# Patient Record
Sex: Female | Born: 1965 | Race: White | Hispanic: No | Marital: Married | Smoking: Never smoker
Health system: Southern US, Community
[De-identification: ages and names within clinical notes are randomized; demographics above are authoritative.]

## PROBLEM LIST (undated history)

## (undated) HISTORY — PX: DILATION AND EVACUATION: SHX1459

---

## 1997-11-21 ENCOUNTER — Emergency Department (HOSPITAL_COMMUNITY): Admission: EM | Admit: 1997-11-21 | Discharge: 1997-11-21 | Payer: Self-pay | Admitting: Emergency Medicine

## 1997-11-29 ENCOUNTER — Encounter: Admission: RE | Admit: 1997-11-29 | Discharge: 1997-11-29 | Payer: Self-pay | Admitting: Internal Medicine

## 1998-05-02 ENCOUNTER — Other Ambulatory Visit: Admission: RE | Admit: 1998-05-02 | Discharge: 1998-05-02 | Payer: Self-pay | Admitting: Gynecology

## 1998-05-12 ENCOUNTER — Inpatient Hospital Stay (HOSPITAL_COMMUNITY): Admission: AD | Admit: 1998-05-12 | Discharge: 1998-05-12 | Payer: Self-pay | Admitting: Gynecology

## 1998-11-25 ENCOUNTER — Inpatient Hospital Stay (HOSPITAL_COMMUNITY): Admission: AD | Admit: 1998-11-25 | Discharge: 1998-11-30 | Payer: Self-pay | Admitting: Gynecology

## 1998-12-25 ENCOUNTER — Encounter: Admission: RE | Admit: 1998-12-25 | Discharge: 1999-03-25 | Payer: Self-pay | Admitting: Gynecology

## 1998-12-31 ENCOUNTER — Other Ambulatory Visit: Admission: RE | Admit: 1998-12-31 | Discharge: 1998-12-31 | Payer: Self-pay | Admitting: Gynecology

## 1999-03-28 ENCOUNTER — Encounter: Admission: RE | Admit: 1999-03-28 | Discharge: 1999-06-03 | Payer: Self-pay | Admitting: Gynecology

## 2000-03-19 ENCOUNTER — Encounter: Payer: Self-pay | Admitting: Family Medicine

## 2000-03-19 ENCOUNTER — Encounter: Admission: RE | Admit: 2000-03-19 | Discharge: 2000-03-19 | Payer: Self-pay | Admitting: Family Medicine

## 2001-03-07 ENCOUNTER — Other Ambulatory Visit: Admission: RE | Admit: 2001-03-07 | Discharge: 2001-03-07 | Payer: Self-pay | Admitting: Family Medicine

## 2002-05-12 ENCOUNTER — Other Ambulatory Visit: Admission: RE | Admit: 2002-05-12 | Discharge: 2002-05-12 | Payer: Self-pay | Admitting: Family Medicine

## 2002-06-19 ENCOUNTER — Encounter: Admission: RE | Admit: 2002-06-19 | Discharge: 2002-06-19 | Payer: Self-pay | Admitting: Family Medicine

## 2002-06-19 ENCOUNTER — Encounter: Payer: Self-pay | Admitting: Family Medicine

## 2003-02-02 ENCOUNTER — Encounter: Admission: RE | Admit: 2003-02-02 | Discharge: 2003-02-02 | Payer: Self-pay | Admitting: Family Medicine

## 2003-05-17 ENCOUNTER — Other Ambulatory Visit: Admission: RE | Admit: 2003-05-17 | Discharge: 2003-05-17 | Payer: Self-pay | Admitting: Family Medicine

## 2003-05-17 ENCOUNTER — Encounter: Admission: RE | Admit: 2003-05-17 | Discharge: 2003-05-17 | Payer: Self-pay | Admitting: Family Medicine

## 2004-03-19 ENCOUNTER — Ambulatory Visit: Payer: Self-pay | Admitting: Family Medicine

## 2004-03-26 ENCOUNTER — Other Ambulatory Visit: Admission: RE | Admit: 2004-03-26 | Discharge: 2004-03-26 | Payer: Self-pay | Admitting: Family Medicine

## 2004-03-26 ENCOUNTER — Ambulatory Visit: Payer: Self-pay | Admitting: Family Medicine

## 2005-01-29 ENCOUNTER — Ambulatory Visit: Payer: Self-pay | Admitting: Family Medicine

## 2005-02-13 ENCOUNTER — Encounter: Admission: RE | Admit: 2005-02-13 | Discharge: 2005-02-13 | Payer: Self-pay | Admitting: Family Medicine

## 2005-03-19 ENCOUNTER — Ambulatory Visit: Payer: Self-pay | Admitting: Family Medicine

## 2005-03-26 ENCOUNTER — Other Ambulatory Visit: Admission: RE | Admit: 2005-03-26 | Discharge: 2005-03-26 | Payer: Self-pay | Admitting: Family Medicine

## 2005-03-26 ENCOUNTER — Encounter: Payer: Self-pay | Admitting: Family Medicine

## 2005-03-26 ENCOUNTER — Ambulatory Visit: Payer: Self-pay | Admitting: Family Medicine

## 2005-08-17 ENCOUNTER — Ambulatory Visit: Payer: Self-pay | Admitting: Family Medicine

## 2005-12-16 ENCOUNTER — Ambulatory Visit: Payer: Self-pay | Admitting: Family Medicine

## 2006-02-17 ENCOUNTER — Encounter: Admission: RE | Admit: 2006-02-17 | Discharge: 2006-02-17 | Payer: Self-pay | Admitting: Family Medicine

## 2006-04-19 ENCOUNTER — Ambulatory Visit: Payer: Self-pay | Admitting: Family Medicine

## 2006-04-19 LAB — CONVERTED CEMR LAB
ALT: 21 units/L (ref 0–40)
AST: 22 units/L (ref 0–37)
Basophils Absolute: 0.1 10*3/uL (ref 0.0–0.1)
Bilirubin, Direct: 0.2 mg/dL (ref 0.0–0.3)
Calcium: 8.9 mg/dL (ref 8.4–10.5)
Cholesterol: 209 mg/dL (ref 0–200)
Direct LDL: 135.2 mg/dL
Eosinophils Absolute: 0.1 10*3/uL (ref 0.0–0.6)
Eosinophils Relative: 0.9 % (ref 0.0–5.0)
Glucose, Bld: 62 mg/dL — ABNORMAL LOW (ref 70–99)
Hemoglobin: 13.9 g/dL (ref 12.0–15.0)
Lymphocytes Relative: 23.2 % (ref 12.0–46.0)
Monocytes Absolute: 0.7 10*3/uL (ref 0.2–0.7)
Neutro Abs: 7.6 10*3/uL (ref 1.4–7.7)
Neutrophils Relative %: 69.5 % (ref 43.0–77.0)
Platelets: 248 10*3/uL (ref 150–400)
Potassium: 3.8 meq/L (ref 3.5–5.1)
RDW: 11.9 % (ref 11.5–14.6)
TSH: 2.75 microintl units/mL (ref 0.35–5.50)
Total Bilirubin: 0.9 mg/dL (ref 0.3–1.2)
Total CHOL/HDL Ratio: 3.6

## 2006-04-30 ENCOUNTER — Encounter: Payer: Self-pay | Admitting: Family Medicine

## 2006-04-30 ENCOUNTER — Other Ambulatory Visit: Admission: RE | Admit: 2006-04-30 | Discharge: 2006-04-30 | Payer: Self-pay | Admitting: Family Medicine

## 2006-04-30 ENCOUNTER — Ambulatory Visit: Payer: Self-pay | Admitting: Family Medicine

## 2006-10-18 ENCOUNTER — Encounter: Payer: Self-pay | Admitting: Family Medicine

## 2006-12-29 DIAGNOSIS — J309 Allergic rhinitis, unspecified: Secondary | ICD-10-CM | POA: Insufficient documentation

## 2007-02-21 ENCOUNTER — Encounter: Admission: RE | Admit: 2007-02-21 | Discharge: 2007-02-21 | Payer: Self-pay | Admitting: Family Medicine

## 2007-04-14 ENCOUNTER — Other Ambulatory Visit: Admission: RE | Admit: 2007-04-14 | Discharge: 2007-04-14 | Payer: Self-pay | Admitting: Gynecology

## 2007-06-15 DIAGNOSIS — N3000 Acute cystitis without hematuria: Secondary | ICD-10-CM

## 2007-06-21 ENCOUNTER — Ambulatory Visit: Payer: Self-pay | Admitting: Family Medicine

## 2007-06-21 LAB — CONVERTED CEMR LAB
Bilirubin Urine: NEGATIVE
Glucose, Urine, Semiquant: NEGATIVE
Nitrite: NEGATIVE
Protein, U semiquant: NEGATIVE
Urobilinogen, UA: NEGATIVE

## 2008-05-28 ENCOUNTER — Encounter: Admission: RE | Admit: 2008-05-28 | Discharge: 2008-05-28 | Payer: Self-pay | Admitting: Family Medicine

## 2008-05-28 ENCOUNTER — Encounter: Payer: Self-pay | Admitting: Gynecology

## 2008-05-28 ENCOUNTER — Other Ambulatory Visit: Admission: RE | Admit: 2008-05-28 | Discharge: 2008-05-28 | Payer: Self-pay | Admitting: Gynecology

## 2008-05-28 ENCOUNTER — Ambulatory Visit: Payer: Self-pay | Admitting: Gynecology

## 2008-07-27 ENCOUNTER — Ambulatory Visit: Payer: Self-pay | Admitting: Gynecology

## 2008-12-25 ENCOUNTER — Ambulatory Visit: Payer: Self-pay | Admitting: Gynecology

## 2009-05-29 ENCOUNTER — Ambulatory Visit: Payer: Self-pay | Admitting: Gynecology

## 2009-05-29 ENCOUNTER — Other Ambulatory Visit: Admission: RE | Admit: 2009-05-29 | Discharge: 2009-05-29 | Payer: Self-pay | Admitting: Gynecology

## 2009-06-03 ENCOUNTER — Encounter: Admission: RE | Admit: 2009-06-03 | Discharge: 2009-06-03 | Payer: Self-pay | Admitting: Gynecology

## 2009-06-10 ENCOUNTER — Encounter: Admission: RE | Admit: 2009-06-10 | Discharge: 2009-06-10 | Payer: Self-pay | Admitting: Gynecology

## 2009-06-19 ENCOUNTER — Ambulatory Visit: Payer: Self-pay | Admitting: Gynecology

## 2009-12-04 ENCOUNTER — Encounter: Admission: RE | Admit: 2009-12-04 | Discharge: 2009-12-04 | Payer: Self-pay | Admitting: Gynecology

## 2010-03-02 HISTORY — PX: BREAST CYST ASPIRATION: SHX578

## 2010-05-05 ENCOUNTER — Other Ambulatory Visit: Payer: Self-pay | Admitting: Gynecology

## 2010-05-05 DIAGNOSIS — Z1231 Encounter for screening mammogram for malignant neoplasm of breast: Secondary | ICD-10-CM

## 2010-06-10 ENCOUNTER — Ambulatory Visit
Admission: RE | Admit: 2010-06-10 | Discharge: 2010-06-10 | Disposition: A | Discharge status: Home / Self Care | Payer: BLUE CROSS/BLUE SHIELD | Source: Ambulatory Visit | Attending: Gynecology | Admitting: Gynecology

## 2010-06-10 DIAGNOSIS — Z1231 Encounter for screening mammogram for malignant neoplasm of breast: Secondary | ICD-10-CM

## 2010-06-18 ENCOUNTER — Encounter (INDEPENDENT_AMBULATORY_CARE_PROVIDER_SITE_OTHER): Payer: BC Managed Care – PPO | Admitting: Gynecology

## 2010-06-18 ENCOUNTER — Other Ambulatory Visit: Payer: Self-pay | Admitting: Gynecology

## 2010-06-18 ENCOUNTER — Other Ambulatory Visit (HOSPITAL_COMMUNITY)
Admission: RE | Admit: 2010-06-18 | Discharge: 2010-06-18 | Disposition: A | Discharge status: Home / Self Care | Payer: BC Managed Care – PPO | Source: Ambulatory Visit | Attending: Gynecology | Admitting: Gynecology

## 2010-06-18 DIAGNOSIS — R635 Abnormal weight gain: Secondary | ICD-10-CM

## 2010-06-18 DIAGNOSIS — Z13220 Encounter for screening for lipoid disorders: Secondary | ICD-10-CM

## 2010-06-18 DIAGNOSIS — Z01419 Encounter for gynecological examination (general) (routine) without abnormal findings: Secondary | ICD-10-CM

## 2010-06-18 DIAGNOSIS — R82998 Other abnormal findings in urine: Secondary | ICD-10-CM

## 2010-06-18 DIAGNOSIS — Z833 Family history of diabetes mellitus: Secondary | ICD-10-CM

## 2010-06-18 DIAGNOSIS — Z124 Encounter for screening for malignant neoplasm of cervix: Secondary | ICD-10-CM | POA: Insufficient documentation

## 2010-07-17 ENCOUNTER — Other Ambulatory Visit: Payer: BC Managed Care – PPO

## 2010-07-18 NOTE — Discharge Summary (Signed)
[name]  PatientLAKEITA Jacobs                            MRN: [id number] Adm. Date:  [id number] Disc. Date: 11/30/98 Attending:  Merrily Pew Dictator:   Gwendolyn Fill. Gatewood, P.A.                           Discharge Summary  DISCHARGE DIAGNOSES: 1. Intrauterine pregnancy 39 weeks, delivered. 2. Mild pregnancy-induced hypertension with decreased platelets. 3. Arrested descent, suspect cephalopelvic disproportion. 4. Status post primary cesarean section, low transverse, Dr. Douglass Rivers on    November 27, 1998.  HISTORY:  This is a 45 years of age female, gravida 2, para 0, with an EDC of November 30, 1998, whose prenatal course was complicated by having a history of positive GC on May 02, 1998, which was treated.  She was also found to have mild increase in blood pressure with mild proteinuria and decreased platelets.  She therefore was admitted on November 26, 1998.  HOSPITAL COURSE:  On November 26, 1998, the patient was admitted for mild PIH ith decreased platelets, question idiopathic decrease in platelets with mild PIH versus HELLP syndrome.  Patient was found to be contracting in labor with augmenting with Pitocin and patient in labor was diagnosed with arrested descent, suspect cephalopelvic disproportion and therefore by Dr. Douglass Rivers underwent a primary cesarean section, low flap transverse on November 27, 1998, and at 10:08 underwent delivery of a female, Apgars of 9 and 9, weight 8 pounds and 0 ounces.  There were no complications.  Postpartum platelet count on November 28, 1998, was 105,000. Patient was asymptomatic and otherwise doing well.  On November 29, 1998 platelets had increased to 128,000.  Blood pressure was normal.  She remained afebrile and voiding.  On November 30, 1998 patient was felt stable for discharge and was given [facility] Gynecology postpartum instructions and postpartum  booklet.  ACCESSORY CLINICAL FINDINGS/LABORATORY:  Patient is O positive, rubella immune. On November 26, 1998, hemoglobin 13.3, hematocrit 39.2, platelets 115,000.  On discharge on November 29, 1998, platelets were 128,000.  DISPOSITION:  The patient is discharged to home.  FOLLOWUP:  Return in six weeks.  If any problems prior to that time, to be seen in the office.  DISCHARGE MEDICATIONS:  She was given a prescription for Percocet p.r.n. pain.  She is to have a CBC at postpartum visit. DD:  01/07/99 TD:  01/07/99 Job: [id number]

## 2010-09-02 ENCOUNTER — Other Ambulatory Visit (INDEPENDENT_AMBULATORY_CARE_PROVIDER_SITE_OTHER): Payer: BC Managed Care – PPO

## 2010-09-02 DIAGNOSIS — E7800 Pure hypercholesterolemia, unspecified: Secondary | ICD-10-CM

## 2011-05-25 ENCOUNTER — Other Ambulatory Visit: Payer: Self-pay | Admitting: Gynecology

## 2011-07-06 ENCOUNTER — Other Ambulatory Visit: Payer: Self-pay | Admitting: Gynecology

## 2011-07-06 DIAGNOSIS — Z1231 Encounter for screening mammogram for malignant neoplasm of breast: Secondary | ICD-10-CM

## 2011-07-24 ENCOUNTER — Encounter: Payer: Self-pay | Admitting: Gynecology

## 2011-07-24 ENCOUNTER — Ambulatory Visit (INDEPENDENT_AMBULATORY_CARE_PROVIDER_SITE_OTHER): Payer: BC Managed Care – PPO | Admitting: Gynecology

## 2011-07-24 VITALS — BP 142/98 | Ht 66.0 in | Wt 164.0 lb

## 2011-07-24 DIAGNOSIS — Z309 Encounter for contraceptive management, unspecified: Secondary | ICD-10-CM

## 2011-07-24 DIAGNOSIS — W57XXXA Bitten or stung by nonvenomous insect and other nonvenomous arthropods, initial encounter: Secondary | ICD-10-CM

## 2011-07-24 DIAGNOSIS — R635 Abnormal weight gain: Secondary | ICD-10-CM

## 2011-07-24 DIAGNOSIS — Z01419 Encounter for gynecological examination (general) (routine) without abnormal findings: Secondary | ICD-10-CM

## 2011-07-24 LAB — CBC WITH DIFFERENTIAL/PLATELET
Eosinophils Absolute: 0.1 10*3/uL (ref 0.0–0.7)
Eosinophils Relative: 1 % (ref 0–5)
Lymphocytes Relative: 25 % (ref 12–46)
Lymphs Abs: 1.9 10*3/uL (ref 0.7–4.0)
MCHC: 33.4 g/dL (ref 30.0–36.0)
MCV: 89.5 fL (ref 78.0–100.0)
Monocytes Relative: 5 % (ref 3–12)
Neutro Abs: 5.1 10*3/uL (ref 1.7–7.7)
Neutrophils Relative %: 69 % (ref 43–77)
Platelets: 235 10*3/uL (ref 150–400)
RDW: 13.6 % (ref 11.5–15.5)

## 2011-07-24 LAB — LIPID PANEL
LDL Cholesterol: 108 mg/dL — ABNORMAL HIGH (ref 0–99)
Total CHOL/HDL Ratio: 3.2 Ratio
VLDL: 24 mg/dL (ref 0–40)

## 2011-07-24 LAB — THYROID PANEL WITH TSH: T4, Total: 11.9 ug/dL (ref 5.0–12.5)

## 2011-07-24 LAB — GLUCOSE, RANDOM: Glucose, Bld: 85 mg/dL (ref 70–99)

## 2011-07-24 MED ORDER — CEFUROXIME AXETIL 250 MG PO TABS: 500.0000 mg | ORAL_TABLET | Freq: Two times a day (BID) | ORAL | Status: AC

## 2011-07-24 MED ORDER — ETONOGESTREL-ETHINYL ESTRADIOL 0.12-0.015 MG/24HR VA RING: VAGINAL_RING | VAGINAL | Status: DC

## 2011-07-24 MED ORDER — DOXYCYCLINE HYCLATE 50 MG PO CAPS: 100.0000 mg | ORAL_CAPSULE | Freq: Two times a day (BID) | ORAL | Status: DC

## 2011-07-24 NOTE — Progress Notes (Signed)
Amy Jacobs [birth year] [id number]   History:    46 y.o.  for annual gyn exam with no complaints today. Patient requesting refill for her NuvaRing which she uses for contraception. Patient scheduled for mammogram next week. Her Pap smear mammogram last year were normal. She states her menstrual cycles are regular. She has increased weight recently.  Past medical history,surgical history, family history and social history were all reviewed and documented in the EPIC chart.  Gynecologic History Patient's last menstrual period was 07/03/2011. Contraception: NuvaRing vaginal inserts Last Pap: 2012. Results were: normal Last mammogram: 2012. Results were: normal  Obstetric History OB History    Grav Para Term Preterm Abortions TAB SAB Ect Mult Living   2 1 1  1     1      # Outc Date GA Lbr Len/2nd Wgt Sex Del Anes PTL Lv   1 TRM     F CS  No Yes   2 ABT                ROS: A ROS was performed and pertinent positives and negatives are included in the history.  GENERAL: No fevers or chills. HEENT: No change in vision, no earache, sore throat or sinus congestion. NECK: No pain or stiffness. CARDIOVASCULAR: No chest pain or pressure. No palpitations. PULMONARY: No shortness of breath, cough or wheeze. GASTROINTESTINAL: No abdominal pain, nausea, vomiting or diarrhea, melena or bright red blood per rectum. GENITOURINARY: No urinary frequency, urgency, hesitancy or dysuria. MUSCULOSKELETAL: No joint or muscle pain, no back pain, no recent trauma. DERMATOLOGIC recent medial left side but bite:ENDOCRINE: No polyuria, polydipsia, no heat or cold intolerance. No recent change in weight. HEMATOLOGICAL: No anemia or easy bruising or bleeding. NEUROLOGIC: No headache, seizures, numbness, tingling or weakness. PSYCHIATRIC: No depression, no loss of interest in normal activity or change in sleep pattern.     Exam: chaperone present  BP 142/98  Ht 5\' 6"  (1.676 m)  Wt 164 lb (74.39 kg)  BMI 26.47 kg/m2   LMP 07/03/2011  Body mass index is 26.47 kg/(m^2).  General appearance : Well developed well nourished female. No acute distress HEENT: Neck supple, trachea midline, no carotid bruits, no thyroidmegaly Lungs: Clear to auscultation, no rhonchi or wheezes, or rib retractions  Heart: Regular rate and rhythm, no murmurs or gallops Breast:Examined in sitting and supine position were symmetrical in appearance, no palpable masses or tenderness,  no skin retraction, no nipple inversion, no nipple discharge, no skin discoloration, no axillary or supraclavicular lymphadenopathy Abdomen: no palpable masses or tenderness, no rebound or guarding Extremities: Medial left thigh 3 erythematous area from recent but bite  Pelvic:  Bartholin, Urethra, Skene Glands: Within normal limits             Vagina: No gross lesions or discharge  Cervix: No gross lesions or discharge  Uterus  anteverted, normal size, shape and consistency, non-tender and mobile  Adnexa  Without masses or tenderness  Anus and perineum  normal   Rectovaginal  normal sphincter tone without palpated masses or tenderness             Hemoccult not done     Assessment/Plan:  46 y.o. female for annual exam who was noted to have on the left medial thigh 3 erythematous areas and when patient was questioned she stated that it appears she had a bug bite from sitting on a bench outdoors recently. She will be placed on Ceftin 500 mg twice a day  for 10 days. She'll be instructed to apply Neosporin twice a day to the area and keep clean. The following labs ordered today: Fasting blood sugar, TSH, fasting lipid profile, CBC, and urinalysis. Pap smear was not done today and screening guidelines discussed. She was instructed to her monthly self breast examination. We discussed importance of calcium vitamin D for osteoporosis prevention as well as regular exercise. I would like her to take her blood pressure at home at least once a day for the next week  and then to see me those readings to review are for her to followup with Dr. Kelle Darting her primary physician for further evaluation of her possible hypertension. Repeat blood pressure was 130/86.    Ok Edwards MD, 9:07 AM 07/24/2011

## 2011-07-24 NOTE — Patient Instructions (Signed)
Cholesterol Control Diet  Cholesterol levels in your body are determined significantly by your diet. Cholesterol levels may also be related to heart disease. The following material helps to explain this relationship and discusses what you can do to help keep your heart healthy. Not all cholesterol is bad. Low-density lipoprotein (LDL) cholesterol is the "bad" cholesterol. It may cause fatty deposits to build up inside your arteries. High-density lipoprotein (HDL) cholesterol is "good." It helps to remove the "bad" LDL cholesterol from your blood. Cholesterol is a very important risk factor for heart disease. Other risk factors are high blood pressure, smoking, stress, heredity, and weight. The heart muscle gets its supply of blood through the coronary arteries. If your LDL cholesterol is high and your HDL cholesterol is low, you are at risk for having fatty deposits build up in your coronary arteries. This leaves less room through which blood can flow. Without sufficient blood and oxygen, the heart muscle cannot function properly and you may feel chest pains (angina pectoris). When a coronary artery closes up entirely, a part of the heart muscle may die, causing a heart attack (myocardial infarction). CHECKING CHOLESTEROL When your caregiver sends your blood to a lab to be analyzed for cholesterol, a complete lipid (fat) profile may be done. With this test, the total amount of cholesterol and levels of LDL and HDL are determined. Triglycerides are a type of fat that circulates in the blood and can also be used to determine heart disease risk. The list below describes what the numbers should be: Test: Total Cholesterol.  Less than 200 mg/dl.  Test: LDL "bad cholesterol."  Less than 100 mg/dl.   Less than 70 mg/dl if you are at very high risk of a heart attack or sudden cardiac death.  Test: HDL "good cholesterol."  Greater than 50 mg/dl for women.    Greater than 40 mg/dl for men.  Test: Triglycerides.  Less than 150 mg/dl.  CONTROLLING CHOLESTEROL WITH DIET Although exercise and lifestyle factors are important, your diet is key. That is because certain foods are known to raise cholesterol and others to lower it. The goal is to balance foods for their effect on cholesterol and more importantly, to replace saturated and trans fat with other types of fat, such as monounsaturated fat, polyunsaturated fat, and omega-3 fatty acids. On average, a person should consume no more than 15 to 17 g of saturated fat daily. Saturated and trans fats are considered "bad" fats, and they will raise LDL cholesterol. Saturated fats are primarily found in animal products such as meats, butter, and cream. However, that does not mean you need to sacrifice all your favorite foods. Today, there are good tasting, low-fat, low-cholesterol substitutes for most of the things you like to eat. Choose low-fat or nonfat alternatives. Choose round or loin cuts of red meat, since these types of cuts are lowest in fat and cholesterol. Chicken (without the skin), fish, veal, and ground [place] breast are excellent choices. Eliminate fatty meats, such as hot dogs and salami. Even shellfish have little or no saturated fat. Have a 3 oz (85 g) portion when you eat lean meat, poultry, or fish. Trans fats are also called "partially hydrogenated oils." They are oils that have been scientifically manipulated so that they are solid at room temperature resulting in a longer shelf life and improved taste and texture of foods in which they are added. Trans fats are found in stick margarine, some tub margarines, cookies, crackers, and baked goods.  When baking and cooking, oils are an excellent substitute for butter. The monounsaturated oils are especially beneficial since it is believed they lower LDL and raise HDL. The oils you should avoid entirely are saturated tropical oils, such as coconut and  palm.  Remember to eat liberally from food groups that are naturally free of saturated and trans fat, including fish, fruit, vegetables, beans, grains (barley, rice, couscous, bulgur wheat), and pasta (without cream sauces).  IDENTIFYING FOODS THAT LOWER CHOLESTEROL  Soluble fiber may lower your cholesterol. This type of fiber is found in fruits such as apples, vegetables such as broccoli, potatoes, and carrots, legumes such as beans, peas, and lentils, and grains such as barley. Foods fortified with plant sterols (phytosterol) may also lower cholesterol. You should eat at least 2 g per day of these foods for a cholesterol lowering effect.  Read package labels to identify low-saturated fats, trans fats free, and low-fat foods at the supermarket. Select cheeses that have only 2 to 3 g saturated fat per ounce. Use a heart-healthy tub margarine that is free of trans fats or partially hydrogenated oil. When buying baked goods (cookies, crackers), avoid partially hydrogenated oils. Breads and muffins should be made from whole grains (whole-wheat or whole oat flour, instead of "flour" or "enriched flour"). Buy non-creamy canned soups with reduced salt and no added fats.  FOOD PREPARATION TECHNIQUES  Never deep-fry. If you must fry, either stir-fry, which uses very little fat, or use non-stick cooking sprays. When possible, broil, bake, or roast meats, and steam vegetables. Instead of dressing vegetables with butter or margarine, use lemon and herbs, applesauce and cinnamon (for squash and sweet potatoes), nonfat yogurt, salsa, and low-fat dressings for salads.  LOW-SATURATED FAT / LOW-FAT FOOD SUBSTITUTES Meats / Saturated Fat (g)  Avoid: Steak, marbled (3 oz/85 g) / 11 g   Choose: Steak, lean (3 oz/85 g) / 4 g   Avoid: Hamburger (3 oz/85 g) / 7 g   Choose: Hamburger, lean (3 oz/85 g) / 5 g   Avoid: Ham (3 oz/85 g) / 6 g   Choose: Ham, lean cut (3 oz/85 g) / 2.4 g   Avoid: Chicken, with skin, dark  meat (3 oz/85 g) / 4 g   Choose: Chicken, skin removed, dark meat (3 oz/85 g) / 2 g   Avoid: Chicken, with skin, light meat (3 oz/85 g) / 2.5 g   Choose: Chicken, skin removed, light meat (3 oz/85 g) / 1 g  Dairy / Saturated Fat (g)  Avoid: Whole milk (1 cup) / 5 g   Choose: Low-fat milk, 2% (1 cup) / 3 g   Choose: Low-fat milk, 1% (1 cup) / 1.5 g   Choose: Skim milk (1 cup) / 0.3 g   Avoid: Hard cheese (1 oz/28 g) / 6 g   Choose: Skim milk cheese (1 oz/28 g) / 2 to 3 g   Avoid: Cottage cheese, 4% fat (1 cup) / 6.5 g   Choose: Low-fat cottage cheese, 1% fat (1 cup) / 1.5 g   Avoid: Ice cream (1 cup) / 9 g   Choose: Sherbet (1 cup) / 2.5 g   Choose: Nonfat frozen yogurt (1 cup) / 0.3 g   Choose: Frozen fruit bar / trace   Avoid: Whipped cream (1 tbs) / 3.5 g   Choose: Nondairy whipped topping (1 tbs) / 1 g  Condiments / Saturated Fat (g)  Avoid: Mayonnaise (1 tbs) / 2 g   Choose: Low-fat  mayonnaise (1 tbs) / 1 g   Avoid: Butter (1 tbs) / 7 g   Choose: Extra light margarine (1 tbs) / 1 g   Avoid: Coconut oil (1 tbs) / 11.8 g   Choose: Olive oil (1 tbs) / 1.8 g   Choose: Corn oil (1 tbs) / 1.7 g   Choose: Safflower oil (1 tbs) / 1.2 g   Choose: Sunflower oil (1 tbs) / 1.4 g   Choose: Soybean oil (1 tbs) / 2.4 g   Choose: Canola oil (1 tbs) / 1 g  Document Released: 02/16/2005 Document Revised: 10/29/2010 Document Reviewed: 08/07/2010 [facility] Patient Information 2012 [place], [place].  Exercise to Lose Weight Exercise and a healthy diet may help you lose weight. Your doctor may suggest specific exercises. EXERCISE IDEAS AND TIPS  Choose low-cost things you enjoy doing, such as walking, bicycling, or exercising to workout videos.   Take stairs instead of the elevator.   Walk during your lunch break.   Park your car further away from work or school.   Go to a gym or an exercise class.   Start with 5 to 10 minutes of exercise each day. Build up to  30 minutes of exercise 4 to 6 days a week.   Wear shoes with good support and comfortable clothes.   Stretch before and after working out.   Work out until you breathe harder and your heart beats faster.   Drink extra water when you exercise.   Do not do so much that you hurt yourself, feel dizzy, or get very short of breath.  Exercises that burn about 150 calories:  Running 1  miles in 15 minutes.   Playing volleyball for 45 to 60 minutes.   Washing and waxing a car for 45 to 60 minutes.   Playing touch football for 45 minutes.   Walking 1  miles in 35 minutes.   Pushing a stroller 1  miles in 30 minutes.   Playing basketball for 30 minutes.   Raking leaves for 30 minutes.   Bicycling 5 miles in 30 minutes.   Walking 2 miles in 30 minutes.   Dancing for 30 minutes.   Shoveling snow for 15 minutes.   Swimming laps for 20 minutes.   Walking up stairs for 15 minutes.   Bicycling 4 miles in 15 minutes.   Gardening for 30 to 45 minutes.   Jumping rope for 15 minutes.   Washing windows or floors for 45 to 60 minutes.  Document Released: 03/21/2010 Document Revised: 10/29/2010 Document Reviewed: 03/21/2010 [facility] Patient Information 2012 [place], [place].    Total calcium daily requirement is 1200-1500mg  per day and no more than 2,000 units of vitamin D

## 2011-07-25 LAB — URINALYSIS W MICROSCOPIC + REFLEX CULTURE
Crystals: NONE SEEN
Leukocytes, UA: NEGATIVE
Nitrite: NEGATIVE
Protein, ur: NEGATIVE mg/dL
Urobilinogen, UA: 0.2 mg/dL (ref 0.0–1.0)
pH: 6 (ref 5.0–8.0)

## 2011-07-26 LAB — URINE CULTURE: Colony Count: NO GROWTH

## 2011-07-30 ENCOUNTER — Ambulatory Visit
Admission: RE | Admit: 2011-07-30 | Discharge: 2011-07-30 | Disposition: A | Discharge status: Home / Self Care | Payer: BC Managed Care – PPO | Source: Ambulatory Visit | Attending: Gynecology | Admitting: Gynecology

## 2011-07-30 DIAGNOSIS — Z1231 Encounter for screening mammogram for malignant neoplasm of breast: Secondary | ICD-10-CM

## 2011-12-01 ENCOUNTER — Other Ambulatory Visit: Payer: Self-pay | Admitting: *Deleted

## 2011-12-01 MED ORDER — ETONOGESTREL-ETHINYL ESTRADIOL 0.12-0.015 MG/24HR VA RING: VAGINAL_RING | VAGINAL | Status: DC

## 2012-07-19 ENCOUNTER — Other Ambulatory Visit: Payer: Self-pay

## 2012-07-19 DIAGNOSIS — Z1231 Encounter for screening mammogram for malignant neoplasm of breast: Secondary | ICD-10-CM

## 2012-08-08 ENCOUNTER — Ambulatory Visit
Admission: RE | Admit: 2012-08-08 | Discharge: 2012-08-08 | Disposition: A | Discharge status: Home / Self Care | Payer: BC Managed Care – PPO | Source: Ambulatory Visit

## 2012-08-08 DIAGNOSIS — Z1231 Encounter for screening mammogram for malignant neoplasm of breast: Secondary | ICD-10-CM

## 2012-10-30 ENCOUNTER — Other Ambulatory Visit: Payer: Self-pay | Admitting: Gynecology

## 2012-11-14 ENCOUNTER — Ambulatory Visit (INDEPENDENT_AMBULATORY_CARE_PROVIDER_SITE_OTHER): Payer: BC Managed Care – PPO | Admitting: Gynecology

## 2012-11-14 ENCOUNTER — Encounter: Payer: Self-pay | Admitting: Gynecology

## 2012-11-14 VITALS — BP 128/76 | Ht 65.75 in | Wt 172.0 lb

## 2012-11-14 DIAGNOSIS — Z01419 Encounter for gynecological examination (general) (routine) without abnormal findings: Secondary | ICD-10-CM

## 2012-11-14 DIAGNOSIS — R635 Abnormal weight gain: Secondary | ICD-10-CM

## 2012-11-14 LAB — CBC WITH DIFFERENTIAL/PLATELET
Basophils Relative: 0 % (ref 0–1)
Eosinophils Relative: 1 % (ref 0–5)
HCT: 41.5 % (ref 36.0–46.0)
Hemoglobin: 14.4 g/dL (ref 12.0–15.0)
MCH: 30.6 pg (ref 26.0–34.0)
MCV: 88.1 fL (ref 78.0–100.0)
Monocytes Absolute: 0.5 10*3/uL (ref 0.1–1.0)
Monocytes Relative: 7 % (ref 3–12)
Neutro Abs: 5.1 10*3/uL (ref 1.7–7.7)
RBC: 4.71 MIL/uL (ref 3.87–5.11)

## 2012-11-14 LAB — LIPID PANEL
Cholesterol: 218 mg/dL — ABNORMAL HIGH (ref 0–200)
HDL: 63 mg/dL (ref >39)
Total CHOL/HDL Ratio: 3.5 Ratio
Triglycerides: 111 mg/dL (ref <150)

## 2012-11-14 LAB — COMPREHENSIVE METABOLIC PANEL
ALT: 14 U/L (ref 0–35)
AST: 14 U/L (ref 0–37)
BUN: 11 mg/dL (ref 6–23)
CO2: 23 mEq/L (ref 19–32)
Creat: 0.86 mg/dL (ref 0.50–1.10)
Sodium: 138 mEq/L (ref 135–145)
Total Bilirubin: 0.4 mg/dL (ref 0.3–1.2)
Total Protein: 6.9 g/dL (ref 6.0–8.3)

## 2012-11-14 MED ORDER — ETONOGESTREL-ETHINYL ESTRADIOL 0.12-0.015 MG/24HR VA RING: 1.0000 | VAGINAL_RING | VAGINAL | Status: DC

## 2012-11-14 NOTE — Patient Instructions (Addendum)
Patient information: High cholesterol (The Basics)  What is cholesterol? - Cholesterol is a substance that is found in the blood. Everyone has some. It is needed for good health. The problem is, people sometimes have too much cholesterol. Compared with people with normal cholesterol, people with high cholesterol have a higher risk of heart attacks, strokes, and other health problems. The higher your cholesterol, the higher your risk of these problems. Cholesterol levels in your body are determined significantly by your diet. Cholesterol levels may also be related to heart disease. The following material helps to explain this relationship and discusses what you can do to help keep your heart healthy. Not all cholesterol is bad. Low-density lipoprotein (LDL) cholesterol is the "bad" cholesterol. It may cause fatty deposits to build up inside your arteries. High-density lipoprotein (HDL) cholesterol is "good." It helps to remove the "bad" LDL cholesterol from your blood. Cholesterol is a very important risk factor for heart disease. Other risk factors are high blood pressure, smoking, stress, heredity, and weight.  The heart muscle gets its supply of blood through the coronary arteries. If your LDL cholesterol is high and your HDL cholesterol is low, you are at risk for having fatty deposits build up in your coronary arteries. This leaves less room through which blood can flow. Without sufficient blood and oxygen, the heart muscle cannot function properly and you may feel chest pains (angina pectoris). When a coronary artery closes up entirely, a part of the heart muscle may die, causing a heart attack (myocardial infarction).  CHECKING CHOLESTEROL When your caregiver sends your blood to a lab to be analyzed for cholesterol, a complete lipid (fat) profile may be done. With this test, the total amount of cholesterol and levels of LDL and HDL are determined. Triglycerides  are a type of fat that circulates in the blood and can also be used to determine heart disease risk. Are there different types of cholesterol? - Yes, there are a few different types. If you get a cholesterol test, you may hear your doctor or nurse talk about: Total cholesterol  LDL cholesterol - Some people call this the "bad" cholesterol. That's because having high LDL levels raises your risk of heart attacks, strokes, and other health problems.  HDL cholesterol - Some people call this the "good" cholesterol. That's because having high HDL levels lowers your risk of heart attacks, strokes, and other health problems.  Non-HDL cholesterol - Non-HDL cholesterol is your total cholesterol minus your HDL cholesterol.  Triglycerides - Triglycerides are not cholesterol. They are a type of fat. But they often get measured when cholesterol is measured. (Having high triglycerides also seems to increase the risk of heart attacks and strokes.)   Keep in mind, though, that many people who cannot meet these goals still have a low risk of heart attacks and strokes. What should I do if my doctor tells me I have high cholesterol? - Ask your doctor what your overall risk of heart attacks and strokes is. High cholesterol, by itself, is not always a reason to worry. Having high cholesterol is just one of many things that can increase your risk of heart attacks and strokes. Other factors that increase your risk include:  Cigarette smoking  High blood pressure  Having a parent, sister, or brother who got heart disease at a young age (Young, in this case, means younger than 55 for men and younger than 65 for women.)  Being a man (Women are at risk, too, but men   have a higher risk.)  Older age  If you are at high risk of heart attacks and strokes, having high cholesterol is a problem. On the other hand, if you have are at low risk, having high cholesterol may not mean much. Should I take medicine to lower cholesterol? - Not  everyone who has high cholesterol needs medicines. Your doctor or nurse will decide if you need them based on your age, family history, and other health concerns.  You should probably take a cholesterol-lowering medicine called a statin if you: Already had a heart attack or stroke  Have known heart disease  Have diabetes  Have a condition called peripheral artery disease, which makes it painful to walk, and happens when the arteries in your legs get clogged with fatty deposits  Have an abdominal aortic aneurysm, which is a widening of the main artery in the belly  Most people with any of the conditions listed above should take a statin no matter what their cholesterol level is. If your doctor or nurse puts you on a statin, stay on it. The medicine may not make you feel any different. But it can help prevent heart attacks, strokes, and death.  Can I lower my cholesterol without medicines? - Yes, you can lower your cholesterol some by:  Avoiding red meat, butter, fried foods, cheese, and other foods that have a lot of saturated fat  Losing weight (if you are overweight)  Being more active Even if these steps do little to change your cholesterol, they can improve your health in many ways.                                                   Cholesterol Control Diet  CONTROLLING CHOLESTEROL WITH DIET Although exercise and lifestyle factors are important, your diet is key. That is because certain foods are known to raise cholesterol and others to lower it. The goal is to balance foods for their effect on cholesterol and more importantly, to replace saturated and trans fat with other types of fat, such as monounsaturated fat, polyunsaturated fat, and omega-3 fatty acids. On average, a person should consume no more than 15 to 17 g of saturated fat daily. Saturated and trans fats are considered "bad" fats, and they will raise LDL cholesterol. Saturated fats are primarily found in animal products such as  meats, butter, and cream. However, that does not mean you need to sacrifice all your favorite foods. Today, there are good tasting, low-fat, low-cholesterol substitutes for most of the things you like to eat. Choose low-fat or nonfat alternatives. Choose round or loin cuts of red meat, since these types of cuts are lowest in fat and cholesterol. Chicken (without the skin), fish, veal, and ground turkey breast are excellent choices. Eliminate fatty meats, such as hot dogs and salami. Even shellfish have little or no saturated fat. Have a 3 oz (85 g) portion when you eat lean meat, poultry, or fish. Trans fats are also called "partially hydrogenated oils." They are oils that have been scientifically manipulated so that they are solid at room temperature resulting in a longer shelf life and improved taste and texture of foods in which they are added. Trans fats are found in stick margarine, some tub margarines, cookies, crackers, and baked goods.  When baking and cooking, oils are an excellent substitute   for butter. The monounsaturated oils are especially beneficial since it is believed they lower LDL and raise HDL. The oils you should avoid entirely are saturated tropical oils, such as coconut and palm.  Remember to eat liberally from food groups that are naturally free of saturated and trans fat, including fish, fruit, vegetables, beans, grains (barley, rice, couscous, bulgur wheat), and pasta (without cream sauces).  IDENTIFYING FOODS THAT LOWER CHOLESTEROL  Soluble fiber may lower your cholesterol. This type of fiber is found in fruits such as apples, vegetables such as broccoli, potatoes, and carrots, legumes such as beans, peas, and lentils, and grains such as barley. Foods fortified with plant sterols (phytosterol) may also lower cholesterol. You should eat at least 2 g per day of these foods for a cholesterol lowering effect.  Read package labels to identify low-saturated fats, trans fats free, and  low-fat foods at the supermarket. Select cheeses that have only 2 to 3 g saturated fat per ounce. Use a heart-healthy tub margarine that is free of trans fats or partially hydrogenated oil. When buying baked goods (cookies, crackers), avoid partially hydrogenated oils. Breads and muffins should be made from whole grains (whole-wheat or whole oat flour, instead of "flour" or "enriched flour"). Buy non-creamy canned soups with reduced salt and no added fats.  FOOD PREPARATION TECHNIQUES  Never deep-fry. If you must fry, either stir-fry, which uses very little fat, or use non-stick cooking sprays. When possible, broil, bake, or roast meats, and steam vegetables. Instead of dressing vegetables with butter or margarine, use lemon and herbs, applesauce and cinnamon (for squash and sweet potatoes), nonfat yogurt, salsa, and low-fat dressings for salads.  LOW-SATURATED FAT / LOW-FAT FOOD SUBSTITUTES Meats / Saturated Fat (g)  Avoid: Steak, marbled (3 oz/85 g) / 11 g   Choose: Steak, lean (3 oz/85 g) / 4 g   Avoid: Hamburger (3 oz/85 g) / 7 g   Choose: Hamburger, lean (3 oz/85 g) / 5 g   Avoid: Ham (3 oz/85 g) / 6 g   Choose: Ham, lean cut (3 oz/85 g) / 2.4 g   Avoid: Chicken, with skin, dark meat (3 oz/85 g) / 4 g   Choose: Chicken, skin removed, dark meat (3 oz/85 g) / 2 g   Avoid: Chicken, with skin, light meat (3 oz/85 g) / 2.5 g   Choose: Chicken, skin removed, light meat (3 oz/85 g) / 1 g  Dairy / Saturated Fat (g)  Avoid: Whole milk (1 cup) / 5 g   Choose: Low-fat milk, 2% (1 cup) / 3 g   Choose: Low-fat milk, 1% (1 cup) / 1.5 g   Choose: Skim milk (1 cup) / 0.3 g   Avoid: Hard cheese (1 oz/28 g) / 6 g   Choose: Skim milk cheese (1 oz/28 g) / 2 to 3 g   Avoid: Cottage cheese, 4% fat (1 cup) / 6.5 g   Choose: Low-fat cottage cheese, 1% fat (1 cup) / 1.5 g   Avoid: Ice cream (1 cup) / 9 g   Choose: Sherbet (1 cup) / 2.5 g   Choose: Nonfat frozen yogurt (1 cup) / 0.3 g    Choose: Frozen fruit bar / trace   Avoid: Whipped cream (1 tbs) / 3.5 g   Choose: Nondairy whipped topping (1 tbs) / 1 g  Condiments / Saturated Fat (g)  Avoid: Mayonnaise (1 tbs) / 2 g   Choose: Low-fat mayonnaise (1 tbs) / 1 g   Avoid:   Butter (1 tbs) / 7 g   Choose: Extra light margarine (1 tbs) / 1 g   Avoid: Coconut oil (1 tbs) / 11.8 g   Choose: Olive oil (1 tbs) / 1.8 g   Choose: Corn oil (1 tbs) / 1.7 g   Choose: Safflower oil (1 tbs) / 1.2 g   Choose: Sunflower oil (1 tbs) / 1.4 g   Choose: Soybean oil (1 tbs) / 2.4 g   Choose: Canola oil (1 tbs) / 1 g  Exercise to Lose Weight Exercise and a healthy diet may help you lose weight. Your doctor may suggest specific exercises. EXERCISE IDEAS AND TIPS  Choose low-cost things you enjoy doing, such as walking, bicycling, or exercising to workout videos.   Take stairs instead of the elevator.   Walk during your lunch break.   Park your car further away from work or school.   Go to a gym or an exercise class.   Start with 5 to 10 minutes of exercise each day. Build up to 30 minutes of exercise 4 to 6 days a week.   Wear shoes with good support and comfortable clothes.   Stretch before and after working out.   Work out until you breathe harder and your heart beats faster.   Drink extra water when you exercise.   Do not do so much that you hurt yourself, feel dizzy, or get very short of breath.  Exercises that burn about 150 calories:  Running 1  miles in 15 minutes.   Playing volleyball for 45 to 60 minutes.   Washing and waxing a car for 45 to 60 minutes.   Playing touch football for 45 minutes.   Walking 1  miles in 35 minutes.   Pushing a stroller 1  miles in 30 minutes.   Playing basketball for 30 minutes.   Raking leaves for 30 minutes.   Bicycling 5 miles in 30 minutes.   Walking 2 miles in 30 minutes.   Dancing for 30 minutes.   Shoveling snow for 15 minutes.   Swimming laps  for 20 minutes.   Walking up stairs for 15 minutes.   Bicycling 4 miles in 15 minutes.   Gardening for 30 to 45 minutes.   Jumping rope for 15 minutes.   Washing windows or floors for 45 to 60 minutes.  Document Released: 03/21/2010 Document Revised: 10/29/2010 Document Reviewed: 03/21/2010 [employer] Patient Information 2012 [employer].                                           

## 2012-11-14 NOTE — Progress Notes (Signed)
Amy Jacobs 06-05-1965 [id number]   History:    47 y.o.  for annual gyn exam with the only complaint is that occasionally she gets gastroesophageal reflux depending on the food that she eats. She also at times felt that she may have lactose intolerance because when she drank her latte she has felt bloated but no longer drinking it. Patient over 30 years ago had cryotherapy of her cervix but subsequent Pap smears have all been normal. She is using the nova ring for contraception and is having regular menstrual cycles. Her primary physician is Dr. Tawanna Cooler and patient has not seen him in over a year. She is currently fasting today for her lab work. Review of her records indicated she was weighing 164 and is now weighing 172 pounds. Her Tdap vaccine is up to date. Her last mammogram was in June of this year which was normal.  Past medical history,surgical history, family history and social history were all reviewed and documented in the EPIC chart.  Gynecologic History Patient's last menstrual period was 10/31/2012. Contraception: NuvaRing vaginal inserts Last Pap: 2012. Results were: normal Last mammogram: 2014. Results were: normal  Obstetric History OB History  Gravida Para Term Preterm AB SAB TAB Ectopic Multiple Living  2 1 1  1     1     # Outcome Date GA Lbr Len/2nd Weight Sex Delivery Anes PTL Lv  2 ABT           1 TRM     F CS  N Y       ROS: A ROS was performed and pertinent positives and negatives are included in the history.  GENERAL: No fevers or chills. HEENT: No change in vision, no earache, sore throat or sinus congestion. NECK: No pain or stiffness. CARDIOVASCULAR: No chest pain or pressure. No palpitations. PULMONARY: No shortness of breath, cough or wheeze. GASTROINTESTINAL: No abdominal pain, nausea, vomiting or diarrhea, melena or bright red blood per rectum. GENITOURINARY: No urinary frequency, urgency, hesitancy or dysuria. MUSCULOSKELETAL: No joint or muscle pain, no back  pain, no recent trauma. DERMATOLOGIC: No rash, no itching, no lesions. ENDOCRINE: No polyuria, polydipsia, no heat or cold intolerance. No recent change in weight. HEMATOLOGICAL: No anemia or easy bruising or bleeding. NEUROLOGIC: No headache, seizures, numbness, tingling or weakness. PSYCHIATRIC: No depression, no loss of interest in normal activity or change in sleep pattern.     Exam: chaperone present  BP 128/76  Ht 5' 5.75" (1.67 m)  Wt 172 lb (78.019 kg)  BMI 27.97 kg/m2  LMP 10/31/2012  Body mass index is 27.97 kg/(m^2).  General appearance : Well developed well nourished female. No acute distress HEENT: Neck supple, trachea midline, no carotid bruits, no thyroidmegaly Lungs: Clear to auscultation, no rhonchi or wheezes, or rib retractions  Heart: Regular rate and rhythm, no murmurs or gallops Breast:Examined in sitting and supine position were symmetrical in appearance, no palpable masses or tenderness,  no skin retraction, no nipple inversion, no nipple discharge, no skin discoloration, no axillary or supraclavicular lymphadenopathy Abdomen: no palpable masses or tenderness, no rebound or guarding Extremities: no edema or skin discoloration or tenderness  Pelvic:  Bartholin, Urethra, Skene Glands: Within normal limits             Vagina: No gross lesions or discharge  Cervix: No gross lesions or discharge  Uterus  anteverted, normal size, shape and consistency, non-tender and mobile  Adnexa  Without masses or tenderness  Anus and perineum  normal   Rectovaginal  normal sphincter tone without palpated masses or tenderness             Hemoccult cards provided     Assessment/Plan:  47 y.o. female for annual exam with occasional GERD which she takes TUMS when necessary. Hemoccult cards were provided for her to submit to the office for testing. Patient with no family history of GI malignancy. Pap smear not done today. New guidelines were discussed. Patient was reminded on the  importance of monthly breast exam. We also discussed importance of calcium and vitamin D in regular exercise for osteoporosis prevention and weight loss. The following labs were today: Fasting lipid profile, TSH, comprehensive metabolic panel, CBC, and urinalysis.    Ok Edwards MD, 9:34 AM 11/14/2012

## 2012-11-15 LAB — URINALYSIS W MICROSCOPIC + REFLEX CULTURE
Bacteria, UA: NONE SEEN
Crystals: NONE SEEN
Glucose, UA: NEGATIVE mg/dL
Leukocytes, UA: NEGATIVE
Nitrite: NEGATIVE
Protein, ur: NEGATIVE mg/dL
Specific Gravity, Urine: 1.015 (ref 1.005–1.030)
Urobilinogen, UA: 0.2 mg/dL (ref 0.0–1.0)
pH: 5 (ref 5.0–8.0)

## 2012-11-15 LAB — TSH: TSH: 2.618 u[IU]/mL (ref 0.350–4.500)

## 2013-07-13 ENCOUNTER — Other Ambulatory Visit: Payer: Self-pay | Admitting: Gynecology

## 2013-07-28 ENCOUNTER — Other Ambulatory Visit: Payer: Self-pay

## 2013-07-28 DIAGNOSIS — Z1231 Encounter for screening mammogram for malignant neoplasm of breast: Secondary | ICD-10-CM

## 2013-08-25 ENCOUNTER — Ambulatory Visit
Admission: RE | Admit: 2013-08-25 | Discharge: 2013-08-25 | Disposition: A | Discharge status: Home / Self Care | Payer: BC Managed Care – PPO | Source: Ambulatory Visit

## 2013-08-25 ENCOUNTER — Encounter (INDEPENDENT_AMBULATORY_CARE_PROVIDER_SITE_OTHER): Payer: Self-pay

## 2013-08-25 DIAGNOSIS — Z1231 Encounter for screening mammogram for malignant neoplasm of breast: Secondary | ICD-10-CM

## 2013-11-17 ENCOUNTER — Encounter: Payer: BC Managed Care – PPO | Admitting: Gynecology

## 2013-12-04 ENCOUNTER — Other Ambulatory Visit (HOSPITAL_COMMUNITY)
Admission: RE | Admit: 2013-12-04 | Discharge: 2013-12-04 | Disposition: A | Discharge status: Home / Self Care | Payer: BC Managed Care – PPO | Source: Ambulatory Visit | Attending: Gynecology | Admitting: Gynecology

## 2013-12-04 ENCOUNTER — Encounter: Payer: Self-pay | Admitting: Gynecology

## 2013-12-04 ENCOUNTER — Ambulatory Visit (INDEPENDENT_AMBULATORY_CARE_PROVIDER_SITE_OTHER): Payer: BC Managed Care – PPO | Admitting: Gynecology

## 2013-12-04 VITALS — BP 126/82 | Ht 66.0 in | Wt 171.0 lb

## 2013-12-04 DIAGNOSIS — Z01419 Encounter for gynecological examination (general) (routine) without abnormal findings: Secondary | ICD-10-CM

## 2013-12-04 DIAGNOSIS — Z1151 Encounter for screening for human papillomavirus (HPV): Secondary | ICD-10-CM | POA: Insufficient documentation

## 2013-12-04 LAB — CBC WITH DIFFERENTIAL/PLATELET
Basophils Absolute: 0 10*3/uL (ref 0.0–0.1)
Basophils Relative: 0 % (ref 0–1)
Eosinophils Absolute: 0.1 10*3/uL (ref 0.0–0.7)
Eosinophils Relative: 1 % (ref 0–5)
HCT: 40.2 % (ref 36.0–46.0)
Hemoglobin: 14 g/dL (ref 12.0–15.0)
Lymphocytes Relative: 23 % (ref 12–46)
Lymphs Abs: 2.1 10*3/uL (ref 0.7–4.0)
MCH: 30.4 pg (ref 26.0–34.0)
MCHC: 34.8 g/dL (ref 30.0–36.0)
MCV: 87.4 fL (ref 78.0–100.0)
Monocytes Absolute: 0.6 10*3/uL (ref 0.1–1.0)
Monocytes Relative: 6 % (ref 3–12)
Neutro Abs: 6.5 10*3/uL (ref 1.7–7.7)
Neutrophils Relative %: 70 % (ref 43–77)
Platelets: 219 10*3/uL (ref 150–400)
RBC: 4.6 MIL/uL (ref 3.87–5.11)
RDW: 13 % (ref 11.5–15.5)
WBC: 9.3 10*3/uL (ref 4.0–10.5)

## 2013-12-04 LAB — COMPREHENSIVE METABOLIC PANEL
ALT: 19 U/L (ref 0–35)
AST: 16 U/L (ref 0–37)
Albumin: 4.2 g/dL (ref 3.5–5.2)
Alkaline Phosphatase: 58 U/L (ref 39–117)
BUN: 11 mg/dL (ref 6–23)
CO2: 25 mEq/L (ref 19–32)
Calcium: 9.1 mg/dL (ref 8.4–10.5)
Chloride: 104 mEq/L (ref 96–112)
Creat: 0.84 mg/dL (ref 0.50–1.10)
Glucose, Bld: 78 mg/dL (ref 70–99)
Potassium: 4.2 mEq/L (ref 3.5–5.3)
Sodium: 138 mEq/L (ref 135–145)
Total Bilirubin: 0.6 mg/dL (ref 0.2–1.2)
Total Protein: 6.7 g/dL (ref 6.0–8.3)

## 2013-12-04 LAB — LIPID PANEL
Cholesterol: 211 mg/dL — ABNORMAL HIGH (ref 0–200)
HDL: 62 mg/dL (ref >39)
LDL Cholesterol: 119 mg/dL — ABNORMAL HIGH (ref 0–99)
Total CHOL/HDL Ratio: 3.4 Ratio
Triglycerides: 151 mg/dL — ABNORMAL HIGH (ref <150)
VLDL: 30 mg/dL (ref 0–40)

## 2013-12-04 LAB — TSH: TSH: 3.025 u[IU]/mL (ref 0.350–4.500)

## 2013-12-04 MED ORDER — ETONOGESTREL-ETHINYL ESTRADIOL 0.12-0.015 MG/24HR VA RING: 1.0000 | VAGINAL_RING | VAGINAL | Status: DC

## 2013-12-04 NOTE — Patient Instructions (Signed)

## 2013-12-04 NOTE — Progress Notes (Signed)
Amy Jacobs 09/22/1965 [id number]   History:    48 y.o.  for annual gyn exam with no complaints today.Patient over 30 years ago had cryotherapy of her cervix but subsequent Pap smears have all been normal. The patient has been using the NuvaRing for contraception. She has not seen her primary physician in over a year would like to have her fasting blood work done here. Her Tdap vaccine and flu vaccine's are up-to-date.   Past medical history,surgical history, family history and social history were all reviewed and documented in the EPIC chart.  Gynecologic History Patient's last menstrual period was 11/23/2013. Contraception: NuvaRing vaginal inserts Last Pap: 2012. Results were: normal Last mammogram: 2015. Results were: Normal but dense  Obstetric History OB History  Gravida Para Term Preterm AB SAB TAB Ectopic Multiple Living  2 1 1  1     1     # Outcome Date GA Lbr Len/2nd Weight Sex Delivery Anes PTL Lv  2 ABT           1 TRM     F CS  N Y       ROS: A ROS was performed and pertinent positives and negatives are included in the history.  GENERAL: No fevers or chills. HEENT: No change in vision, no earache, sore throat or sinus congestion. NECK: No pain or stiffness. CARDIOVASCULAR: No chest pain or pressure. No palpitations. PULMONARY: No shortness of breath, cough or wheeze. GASTROINTESTINAL: No abdominal pain, nausea, vomiting or diarrhea, melena or bright red blood per rectum. GENITOURINARY: No urinary frequency, urgency, hesitancy or dysuria. MUSCULOSKELETAL: No joint or muscle pain, no back pain, no recent trauma. DERMATOLOGIC: No rash, no itching, no lesions. ENDOCRINE: No polyuria, polydipsia, no heat or cold intolerance. No recent change in weight. HEMATOLOGICAL: No anemia or easy bruising or bleeding. NEUROLOGIC: No headache, seizures, numbness, tingling or weakness. PSYCHIATRIC: No depression, no loss of interest in normal activity or change in sleep pattern.      Exam: chaperone present  BP 126/82  Ht 5\' 6"  (1.676 m)  Wt 171 lb (77.565 kg)  BMI 27.61 kg/m2  LMP 11/23/2013  Body mass index is 27.61 kg/(m^2).  General appearance : Well developed well nourished female. No acute distress HEENT: Neck supple, trachea midline, no carotid bruits, no thyroidmegaly Lungs: Clear to auscultation, no rhonchi or wheezes, or rib retractions  Heart: Regular rate and rhythm, no murmurs or gallops Breast:Examined in sitting and supine position were symmetrical in appearance, no palpable masses or tenderness,  no skin retraction, no nipple inversion, no nipple discharge, no skin discoloration, no axillary or supraclavicular lymphadenopathy Abdomen: no palpable masses or tenderness, no rebound or guarding Extremities: no edema or skin discoloration or tenderness  Pelvic:  Bartholin, Urethra, Skene Glands: Within normal limits             Vagina: No gross lesions or discharge  Cervix: No gross lesions or discharge  Uterus  anteverted, normal size, shape and consistency, non-tender and mobile  Adnexa  Without masses or tenderness  Anus and perineum  normal   Rectovaginal  normal sphincter tone without palpated masses or tenderness             Hemoccult that indicated     Assessment/Plan:  48 y.o. female for annual exam who is reminded of the importance of monthly breast exams. We also discussed importance of calcium vitamin D and regular exercise for osteoporosis prevention. Patient will need a baseline colonoscopy next  year when she turns 49. The following labs were ordered today: CBC, fasting lipid profile, comprehensive metabolic panel, TSH, urinalysis and Pap smear.   Ok Edwards MD, 10:28 AM 12/04/2013

## 2013-12-05 LAB — URINALYSIS W MICROSCOPIC + REFLEX CULTURE
BACTERIA UA: NONE SEEN
Bilirubin Urine: NEGATIVE
CRYSTALS: NONE SEEN
Casts: NONE SEEN
Glucose, UA: NEGATIVE mg/dL
KETONES UR: NEGATIVE mg/dL
LEUKOCYTES UA: NEGATIVE
NITRITE: NEGATIVE
Protein, ur: NEGATIVE mg/dL
SPECIFIC GRAVITY, URINE: 1.019 (ref 1.005–1.030)
UROBILINOGEN UA: 0.2 mg/dL (ref 0.0–1.0)
pH: 5 (ref 5.0–8.0)

## 2013-12-05 LAB — CYTOLOGY - PAP

## 2014-01-01 ENCOUNTER — Encounter: Payer: Self-pay | Admitting: Gynecology

## 2014-04-16 ENCOUNTER — Other Ambulatory Visit: Payer: Self-pay | Admitting: Gynecology

## 2014-07-27 ENCOUNTER — Other Ambulatory Visit: Payer: Self-pay

## 2014-07-27 DIAGNOSIS — Z1231 Encounter for screening mammogram for malignant neoplasm of breast: Secondary | ICD-10-CM

## 2014-09-21 ENCOUNTER — Other Ambulatory Visit: Payer: Self-pay | Admitting: Gynecology

## 2014-09-21 ENCOUNTER — Ambulatory Visit
Admission: RE | Admit: 2014-09-21 | Discharge: 2014-09-21 | Disposition: A | Discharge status: Home / Self Care | Payer: BLUE CROSS/BLUE SHIELD | Source: Ambulatory Visit

## 2014-09-21 DIAGNOSIS — R928 Other abnormal and inconclusive findings on diagnostic imaging of breast: Secondary | ICD-10-CM

## 2014-09-21 DIAGNOSIS — Z1231 Encounter for screening mammogram for malignant neoplasm of breast: Secondary | ICD-10-CM

## 2014-10-01 ENCOUNTER — Ambulatory Visit
Admission: RE | Admit: 2014-10-01 | Discharge: 2014-10-01 | Disposition: A | Discharge status: Home / Self Care | Payer: BLUE CROSS/BLUE SHIELD | Source: Ambulatory Visit | Attending: Gynecology | Admitting: Gynecology

## 2014-10-01 DIAGNOSIS — R928 Other abnormal and inconclusive findings on diagnostic imaging of breast: Secondary | ICD-10-CM

## 2014-12-14 ENCOUNTER — Encounter: Payer: Self-pay | Admitting: Gynecology

## 2014-12-14 ENCOUNTER — Ambulatory Visit (INDEPENDENT_AMBULATORY_CARE_PROVIDER_SITE_OTHER): Payer: BLUE CROSS/BLUE SHIELD | Admitting: Gynecology

## 2014-12-14 VITALS — BP 128/86 | Ht 65.5 in | Wt 174.0 lb

## 2014-12-14 DIAGNOSIS — Z23 Encounter for immunization: Secondary | ICD-10-CM | POA: Diagnosis not present

## 2014-12-14 DIAGNOSIS — Z01419 Encounter for gynecological examination (general) (routine) without abnormal findings: Secondary | ICD-10-CM | POA: Diagnosis not present

## 2014-12-14 MED ORDER — ETONOGESTREL-ETHINYL ESTRADIOL 0.12-0.015 MG/24HR VA RING: VAGINAL_RING | VAGINAL | Status: DC

## 2014-12-14 NOTE — Addendum Note (Signed)
Addended by: Berna SpareASTILLO, BLANCA A on: 12/14/2014 03:17 PM   Modules accepted: [employer]od[employer]Set

## 2014-12-14 NOTE — Progress Notes (Signed)
AYDEN APODACA 05-29-1965 [id number]   History:    49 y.o.  for annual gyn exam with no complaints today. Patient is using the NuvaRing for contraception and states her cycle which is getting lighter but she still having 1 once a month.Patient over 30 years ago had cryotherapy of her cervix but subsequent Pap smears have all been normal. Patient is to receive the flu vaccine today. Her Tdap vaccine is up-to-date. Patient just turned 49.  Mammogram early this year with the following impression reported by radiologist: IMPRESSION: Left breast 2 o'clock benign-appearing mass, for which six-month follow-up is recommended with left mammogram and ultrasound  Past medical history,surgical history, family history and social history were all reviewed and documented in the EPIC chart.  Gynecologic History Patient's last menstrual period was 12/07/2014. Contraception: NuvaRing vaginal inserts Last Pap: 2015. Results were: normal Last mammogram: 2016. Result: IMPRESSION: Left breast 2 o'clock benign-appearing mass, for which six-month follow-up is recommended with left mammogram and ultrasound Obstetric History OB History  Gravida Para Term Preterm AB SAB TAB Ectopic Multiple Living  # Outcome Date GA Lbr Len/2nd Weight Sex Delivery Anes PTL Lv  2 AB           1 Term     F CS-Unspec  N Y       ROS: A ROS was performed and pertinent positives and negatives are included in the history.  GENERAL: No fevers or chills. HEENT: No change in vision, no earache, sore throat or sinus congestion. NECK: No pain or stiffness. CARDIOVASCULAR: No chest pain or pressure. No palpitations. PULMONARY: No shortness of breath, cough or wheeze. GASTROINTESTINAL: No abdominal pain, nausea, vomiting or diarrhea, melena or bright red blood per rectum. GENITOURINARY: No urinary frequency, urgency, hesitancy or dysuria. MUSCULOSKELETAL: No joint or muscle pain, no back pain, no recent trauma.  DERMATOLOGIC: No rash, no itching, no lesions. ENDOCRINE: No polyuria, polydipsia, no heat or cold intolerance. No recent change in weight. HEMATOLOGICAL: No anemia or easy bruising or bleeding. NEUROLOGIC: No headache, seizures, numbness, tingling or weakness. PSYCHIATRIC: No depression, no loss of interest in normal activity or change in sleep pattern.     Exam: chaperone present  BP 128/86 mmHg  Ht 5' 5.5" (1.664 m)  Wt 174 lb (78.926 kg)  BMI 28.50 kg/m2  LMP 12/07/2014  Body mass index is 28.5 kg/(m^2).  General appearance : Well developed well nourished female. No acute distress HEENT: Eyes: no retinal hemorrhage or exudates,  Neck supple, trachea midline, no carotid bruits, no thyroidmegaly Lungs: Clear to auscultation, no rhonchi or wheezes, or rib retractions  Heart: Regular rate and rhythm, no murmurs or gallops Breast:Examined in sitting and supine position were symmetrical in appearance, no palpable masses or tenderness,  no skin retraction, no nipple inversion, no nipple discharge, no skin discoloration, no axillary or supraclavicular lymphadenopathy Abdomen: no palpable masses or tenderness, no rebound or guarding Extremities: no edema or skin discoloration or tenderness  Pelvic:  Bartholin, Urethra, Skene Glands: Within normal limits             Vagina: No gross lesions or discharge  Cervix: No gross lesions or discharge  Uterus  anteverted, normal size, shape and consistency, non-tender and mobile  Adnexa  Without masses or tenderness  Anus and perineum  normal   Rectovaginal  normal sphincter tone without palpated masses or tenderness  Hemoccult colonoscopy this year     Assessment/Plan:  49 y.o. female for annual exam will return back to the office next week in a fasting state for the following screening blood work: Fasting lipid profile, comprehensive metabolic panel, TSH, CBC, and urinalysis. Pap smear not indicated this year. Patient was reminded to  follow-up in 6 months as recommended by the radiologist for diagnostic mammogram. She was provided with the name of community gastroenterologist for her to schedule colonoscopy. We discussed importance of calcium vitamin D and weightbearing exercise for osteoporosis prevention. Patient received the flu vaccine today.   Ok EdwardsFERNANDEZ,JUAN H MD, 2:22 PM 12/14/2014

## 2014-12-17 ENCOUNTER — Other Ambulatory Visit: Payer: BLUE CROSS/BLUE SHIELD

## 2014-12-17 DIAGNOSIS — Z01419 Encounter for gynecological examination (general) (routine) without abnormal findings: Secondary | ICD-10-CM

## 2014-12-17 LAB — LIPID PANEL
CHOL/HDL RATIO: 4.1 ratio (ref <=5.0)
Cholesterol: 204 mg/dL — ABNORMAL HIGH (ref 125–200)
HDL: 50 mg/dL (ref >=46)
LDL CALC: 129 mg/dL (ref <130)
TRIGLYCERIDES: 127 mg/dL (ref <150)
VLDL: 25 mg/dL (ref <30)

## 2014-12-17 LAB — COMPREHENSIVE METABOLIC PANEL
ALBUMIN: 3.9 g/dL (ref 3.6–5.1)
ALT: 16 U/L (ref 6–29)
AST: 15 U/L (ref 10–35)
Alkaline Phosphatase: 40 U/L (ref 33–130)
BUN: 13 mg/dL (ref 7–25)
CALCIUM: 8.8 mg/dL (ref 8.6–10.4)
CHLORIDE: 107 mmol/L (ref 98–110)
CO2: 24 mmol/L (ref 20–31)
Creat: 0.96 mg/dL (ref 0.50–1.05)
GLUCOSE: 81 mg/dL (ref 65–99)
POTASSIUM: 4 mmol/L (ref 3.5–5.3)
Sodium: 140 mmol/L (ref 135–146)
Total Bilirubin: 0.5 mg/dL (ref 0.2–1.2)
Total Protein: 6.5 g/dL (ref 6.1–8.1)

## 2014-12-17 LAB — CBC WITH DIFFERENTIAL/PLATELET
Basophils Absolute: 0 10*3/uL (ref 0.0–0.1)
Basophils Relative: 0 % (ref 0–1)
EOS PCT: 1 % (ref 0–5)
Eosinophils Absolute: 0.1 10*3/uL (ref 0.0–0.7)
HEMATOCRIT: 42.1 % (ref 36.0–46.0)
HEMOGLOBIN: 14 g/dL (ref 12.0–15.0)
LYMPHS ABS: 1.8 10*3/uL (ref 0.7–4.0)
LYMPHS PCT: 30 % (ref 12–46)
MCH: 30.1 pg (ref 26.0–34.0)
MCHC: 33.3 g/dL (ref 30.0–36.0)
MCV: 90.5 fL (ref 78.0–100.0)
MONO ABS: 0.4 10*3/uL (ref 0.1–1.0)
MONOS PCT: 7 % (ref 3–12)
MPV: 9.7 fL (ref 8.6–12.4)
Neutro Abs: 3.7 10*3/uL (ref 1.7–7.7)
Neutrophils Relative %: 62 % (ref 43–77)
Platelets: 192 10*3/uL (ref 150–400)
RBC: 4.65 MIL/uL (ref 3.87–5.11)
RDW: 12.9 % (ref 11.5–15.5)
WBC: 5.9 10*3/uL (ref 4.0–10.5)

## 2014-12-18 LAB — URINALYSIS W MICROSCOPIC + REFLEX CULTURE
BILIRUBIN URINE: NEGATIVE
CASTS: NONE SEEN [LPF]
Crystals: NONE SEEN [HPF]
GLUCOSE, UA: NEGATIVE
HGB URINE DIPSTICK: NEGATIVE
KETONES UR: NEGATIVE
Leukocytes, UA: NEGATIVE
Nitrite: NEGATIVE
Protein, ur: NEGATIVE
Specific Gravity, Urine: 1.022 (ref 1.001–1.035)
YEAST: NONE SEEN [HPF]
pH: 5.5 (ref 5.0–8.0)

## 2014-12-18 LAB — TSH: TSH: 2.967 u[IU]/mL (ref 0.350–4.500)

## 2014-12-19 LAB — URINE CULTURE
Colony Count: NO GROWTH
Organism ID, Bacteria: NO GROWTH

## 2015-04-30 ENCOUNTER — Other Ambulatory Visit: Payer: Self-pay | Admitting: Gynecology

## 2015-04-30 DIAGNOSIS — N6489 Other specified disorders of breast: Secondary | ICD-10-CM

## 2015-05-14 ENCOUNTER — Other Ambulatory Visit: Payer: BLUE CROSS/BLUE SHIELD

## 2015-05-14 ENCOUNTER — Other Ambulatory Visit: Payer: Self-pay | Admitting: Gynecology

## 2015-05-14 ENCOUNTER — Ambulatory Visit
Admission: RE | Admit: 2015-05-14 | Discharge: 2015-05-14 | Disposition: A | Discharge status: Home / Self Care | Payer: BLUE CROSS/BLUE SHIELD | Source: Ambulatory Visit | Attending: Gynecology | Admitting: Gynecology

## 2015-05-14 DIAGNOSIS — N6320 Unspecified lump in the left breast, unspecified quadrant: Secondary | ICD-10-CM

## 2015-05-14 DIAGNOSIS — N6489 Other specified disorders of breast: Secondary | ICD-10-CM

## 2015-05-15 ENCOUNTER — Ambulatory Visit
Admission: RE | Admit: 2015-05-15 | Discharge: 2015-05-15 | Disposition: A | Discharge status: Home / Self Care | Payer: BLUE CROSS/BLUE SHIELD | Source: Ambulatory Visit | Attending: Gynecology | Admitting: Gynecology

## 2015-05-15 ENCOUNTER — Other Ambulatory Visit: Payer: Self-pay | Admitting: Gynecology

## 2015-05-15 DIAGNOSIS — N6320 Unspecified lump in the left breast, unspecified quadrant: Secondary | ICD-10-CM

## 2015-09-24 ENCOUNTER — Other Ambulatory Visit: Payer: Self-pay | Admitting: Gynecology

## 2015-09-24 ENCOUNTER — Other Ambulatory Visit: Payer: Self-pay

## 2015-09-24 DIAGNOSIS — Z1231 Encounter for screening mammogram for malignant neoplasm of breast: Secondary | ICD-10-CM

## 2015-09-24 MED ORDER — ETONOGESTREL-ETHINYL ESTRADIOL 0.12-0.015 MG/24HR VA RING: VAGINAL_RING | VAGINAL | 0 refills | Status: DC

## 2015-09-24 NOTE — Telephone Encounter (Signed)
For some reason Nuvaring Rx has run out but CE not due until Oct and she has it scheduled. Rx sent for refill.

## 2015-09-30 ENCOUNTER — Ambulatory Visit: Payer: BLUE CROSS/BLUE SHIELD

## 2015-10-18 ENCOUNTER — Ambulatory Visit
Admission: RE | Admit: 2015-10-18 | Discharge: 2015-10-18 | Disposition: A | Discharge status: Home / Self Care | Payer: BLUE CROSS/BLUE SHIELD | Source: Ambulatory Visit | Attending: Gynecology | Admitting: Gynecology

## 2015-10-18 DIAGNOSIS — Z1231 Encounter for screening mammogram for malignant neoplasm of breast: Secondary | ICD-10-CM

## 2015-12-16 ENCOUNTER — Encounter: Payer: Self-pay | Admitting: Gynecology

## 2015-12-16 ENCOUNTER — Ambulatory Visit (INDEPENDENT_AMBULATORY_CARE_PROVIDER_SITE_OTHER): Payer: BLUE CROSS/BLUE SHIELD | Admitting: Gynecology

## 2015-12-16 VITALS — BP 124/80 | Ht 65.0 in | Wt 180.0 lb

## 2015-12-16 DIAGNOSIS — Z01419 Encounter for gynecological examination (general) (routine) without abnormal findings: Secondary | ICD-10-CM | POA: Diagnosis not present

## 2015-12-16 DIAGNOSIS — Z23 Encounter for immunization: Secondary | ICD-10-CM

## 2015-12-16 DIAGNOSIS — N951 Menopausal and female climacteric states: Secondary | ICD-10-CM

## 2015-12-16 MED ORDER — ETONOGESTREL-ETHINYL ESTRADIOL 0.12-0.015 MG/24HR VA RING: VAGINAL_RING | VAGINAL | 0 refills | Status: DC

## 2015-12-16 NOTE — Patient Instructions (Addendum)
Influenza Virus Vaccine (Flucelvax) What is this medicine? INFLUENZA VIRUS VACCINE (in floo EN zuh VAHY ruhs vak SEEN) helps to reduce the risk of getting influenza also known as the flu. The vaccine only helps protect you against some strains of the flu. This medicine may be used for other purposes; ask your health care provider or pharmacist if you have questions. What should I tell my health care provider before I take this medicine? They need to know if you have any of these conditions: -bleeding disorder like hemophilia -fever or infection -Guillain-Barre syndrome or other neurological problems -immune system problems -infection with the human immunodeficiency virus (HIV) or AIDS -low blood platelet counts -multiple sclerosis -an unusual or allergic reaction to influenza virus vaccine, other medicines, foods, dyes or preservatives -pregnant or trying to get pregnant -breast-feeding How should I use this medicine? This vaccine is for injection into a muscle. It is given by a health care professional. A copy of Vaccine Information Statements will be given before each vaccination. Read this sheet carefully each time. The sheet may change frequently. Talk to your pediatrician regarding the use of this medicine in children. Special care may be needed. Overdosage: If you think you've taken too much of this medicine contact a poison control center or emergency room at once. Overdosage: If you think you have taken too much of this medicine contact a poison control center or emergency room at once. NOTE: This medicine is only for you. Do not share this medicine with others. What if I miss a dose? This does not apply. What may interact with this medicine? -chemotherapy or radiation therapy -medicines that lower your immune system like etanercept, anakinra, infliximab, and adalimumab -medicines that treat or prevent blood clots like warfarin -phenytoin -steroid medicines like prednisone or  cortisone -theophylline -vaccines This list may not describe all possible interactions. Give your health care provider a list of all the medicines, herbs, non-prescription drugs, or dietary supplements you use. Also tell them if you smoke, drink alcohol, or use illegal drugs. Some items may interact with your medicine. What should I watch for while using this medicine? Report any side effects that do not go away within 3 days to your doctor or health care professional. Call your health care provider if any unusual symptoms occur within 6 weeks of receiving this vaccine. You may still catch the flu, but the illness is not usually as bad. You cannot get the flu from the vaccine. The vaccine will not protect against colds or other illnesses that may cause fever. The vaccine is needed every year. What side effects may I notice from receiving this medicine? Side effects that you should report to your doctor or health care professional as soon as possible: -allergic reactions like skin rash, itching or hives, swelling of the face, lips, or tongue Side effects that usually do not require medical attention (Report these to your doctor or health care professional if they continue or are bothersome.): -fever -headache -muscle aches and pains -pain, tenderness, redness, or swelling at the injection site -tiredness This list may not describe all possible side effects. Call your doctor for medical advice about side effects. You may report side effects to FDA at 1-800-FDA-1088. Where should I keep my medicine? The vaccine will be given by a health care professional in a clinic, pharmacy, doctor's office, or other health care setting. You will not be given vaccine doses to store at home. NOTE: This sheet is a summary. It may not cover   all possible information. If you have questions about this medicine, talk to your doctor, pharmacist, or health care provider.    2016, Elsevier/Gold Standard. (2011-01-28  14:06:47) Perimenopause Perimenopause is the time when your body begins to move into the menopause (no menstrual period for 12 straight months). It is a natural process. Perimenopause can begin 2-8 years before the menopause and usually lasts for 1 year after the menopause. During this time, your ovaries may or may not produce an egg. The ovaries vary in their production of estrogen and progesterone hormones each month. This can cause irregular menstrual periods, difficulty getting pregnant, vaginal bleeding between periods, and uncomfortable symptoms. CAUSES  Irregular production of the ovarian hormones, estrogen and progesterone, and not ovulating every month.  Other causes include:  Tumor of the pituitary gland in the brain.  Medical disease that affects the ovaries.  Radiation treatment.  Chemotherapy.  Unknown causes.  Heavy smoking and excessive alcohol intake can bring on perimenopause sooner. SIGNS AND SYMPTOMS   Hot flashes.  Night sweats.  Irregular menstrual periods.  Decreased sex drive.  Vaginal dryness.  Headaches.  Mood swings.  Depression.  Memory problems.  Irritability.  Tiredness.  Weight gain.  Trouble getting pregnant.  The beginning of losing bone cells (osteoporosis).  The beginning of hardening of the arteries (atherosclerosis). DIAGNOSIS  Your health care provider will make a diagnosis by analyzing your age, menstrual history, and symptoms. He or she will do a physical exam and note any changes in your body, especially your female organs. Female hormone tests may or may not be helpful depending on the amount of female hormones you produce and when you produce them. However, other hormone tests may be helpful to rule out other problems. TREATMENT  In some cases, no treatment is needed. The decision on whether treatment is necessary during the perimenopause should be made by you and your health care provider based on how the symptoms are  affecting you and your lifestyle. Various treatments are available, such as:  Treating individual symptoms with a specific medicine for that symptom.  Herbal medicines that can help specific symptoms.  Counseling.  Group therapy. HOME CARE INSTRUCTIONS   Keep track of your menstrual periods (when they occur, how heavy they are, how long between periods, and how long they last) as well as your symptoms and when they started.  Only take over-the-counter or prescription medicines as directed by your health care provider.  Sleep and rest.  Exercise.  Eat a diet that contains calcium (good for your bones) and soy (acts like the estrogen hormone).  Do not smoke.  Avoid alcoholic beverages.  Take vitamin supplements as recommended by your health care provider. Taking vitamin E may help in certain cases.  Take calcium and vitamin D supplements to help prevent bone loss.  Group therapy is sometimes helpful.  Acupuncture may help in some cases. SEEK MEDICAL CARE IF:   You have questions about any symptoms you are having.  You need a referral to a specialist (gynecologist, psychiatrist, or psychologist). SEEK IMMEDIATE MEDICAL CARE IF:   You have vaginal bleeding.  Your period lasts longer than 8 days.  Your periods are recurring sooner than 21 days.  You have bleeding after intercourse.  You have severe depression.  You have pain when you urinate.  You have severe headaches.  You have vision problems.   This information is not intended to replace advice given to you by your health care provider. Make sure you  discuss any questions you have with your health care provider.   Document Released: 03/26/2004 Document Revised: 03/09/2014 Document Reviewed: 09/15/2012 Elsevier Interactive Patient Education [employer]2016 Elsevier Inc.

## 2015-12-16 NOTE — Progress Notes (Signed)
Amy Jacobs [id number]   History:    50 y.o.  for annual gyn exam who is without any complaints today. Patient has been using the NuvaRing for contraception and reports that her cycles are becoming lighter.Patient over 30 years ago had cryotherapy of her cervix but subsequent Pap smears have all been normal. Patient is to receive the flu vaccine today. Her Tdap vaccine is up-to-date. Patient requesting flu vaccine today.  Past medical history,surgical history, family history and social history were all reviewed and documented in the EPIC chart.  Gynecologic History Patient's last menstrual period was 12/04/2015. Contraception: NuvaRing vaginal inserts Last Pap: 2012 and 2015. Results were: normal Last mammogram: 2017. Results were: normal  Obstetric History OB History  Gravida Para Term Preterm AB Living  2 1 1   1 1   SAB TAB Ectopic Multiple Live Births          1    # Outcome Date GA Lbr Len/2nd Weight Sex Delivery Anes PTL Lv  2 AB           1 Term     F CS-Unspec  N LIV       ROS: A ROS was performed and pertinent positives and negatives are included in the history.  GENERAL: No fevers or chills. HEENT: No change in vision, no earache, sore throat or sinus congestion. NECK: No pain or stiffness. CARDIOVASCULAR: No chest pain or pressure. No palpitations. PULMONARY: No shortness of breath, cough or wheeze. GASTROINTESTINAL: No abdominal pain, nausea, vomiting or diarrhea, melena or bright red blood per rectum. GENITOURINARY: No urinary frequency, urgency, hesitancy or dysuria. MUSCULOSKELETAL: No joint or muscle pain, no back pain, no recent trauma. DERMATOLOGIC: No rash, no itching, no lesions. ENDOCRINE: No polyuria, polydipsia, no heat or cold intolerance. No recent change in weight. HEMATOLOGICAL: No anemia or easy bruising or bleeding. NEUROLOGIC: No headache, seizures, numbness, tingling or weakness. PSYCHIATRIC: No depression, no loss of interest in normal  activity or change in sleep pattern.     Exam: chaperone present  BP 124/80   Ht 5\' 5"  (1.651 m)   Wt 180 lb (81.6 kg)   LMP 12/04/2015   BMI 29.95 kg/m   Body mass index is 29.95 kg/m.  General appearance : Well developed well nourished female. No acute distress HEENT: Eyes: no retinal hemorrhage or exudates,  Neck supple, trachea midline, no carotid bruits, no thyroidmegaly Lungs: Clear to auscultation, no rhonchi or wheezes, or rib retractions  Heart: Regular rate and rhythm, no murmurs or gallops Breast:Examined in sitting and supine position were symmetrical in appearance, no palpable masses or tenderness,  no skin retraction, no nipple inversion, no nipple discharge, no skin discoloration, no axillary or supraclavicular lymphadenopathy Abdomen: no palpable masses or tenderness, no rebound or guarding Extremities: no edema or skin discoloration or tenderness  Pelvic:  Bartholin, Urethra, Skene Glands: Within normal limits             Vagina: No gross lesions or discharge, vaginal ring in place  Cervix: No gross lesions or discharge  Uterus  anteverted, normal size, shape and consistency, non-tender and mobile  Adnexa  Without masses or tenderness  Anus and perineum  normal   Rectovaginal  normal sphincter tone without palpated masses or tenderness             Hemoccult needs to schedule colonoscopy    Assessment/Plan:  50 y.o. female for annual exam was reminded to schedule her screening colonoscopy.  Patient will return back to the office on the week that she's off the NuvaRing so that we can check her [facility] since she may be perimenopausal at this point and may no longer need to be on contraception. We discussed potential risk of DVT and pulmonary embolism. When she returns to check her [facility] will also a fasting state screen for the following: Fasting lipid profile, comprehensive metabolic panel, TSH, CBC, and urinalysis. Patient received the flu vaccine today after she was  counseled.   Ok Edwards MD, 10:11 AM 12/16/2015

## 2015-12-17 LAB — URINALYSIS W MICROSCOPIC + REFLEX CULTURE
Bacteria, UA: NONE SEEN [HPF]
Bilirubin Urine: NEGATIVE
CASTS: NONE SEEN [LPF]
Crystals: NONE SEEN [HPF]
Glucose, UA: NEGATIVE
HGB URINE DIPSTICK: NEGATIVE
Ketones, ur: NEGATIVE
Leukocytes, UA: NEGATIVE
NITRITE: NEGATIVE
PH: 6 (ref 5.0–8.0)
Protein, ur: NEGATIVE
RBC / HPF: NONE SEEN RBC/HPF (ref <=2)
SPECIFIC GRAVITY, URINE: 1.013 (ref 1.001–1.035)
WBC, UA: NONE SEEN WBC/HPF (ref <=5)
YEAST: NONE SEEN [HPF]

## 2016-04-12 ENCOUNTER — Other Ambulatory Visit: Payer: Self-pay | Admitting: Gynecology

## 2016-07-05 ENCOUNTER — Other Ambulatory Visit: Payer: Self-pay | Admitting: Gynecology

## 2016-07-15 ENCOUNTER — Encounter: Payer: Self-pay | Admitting: Gynecology

## 2016-07-20 ENCOUNTER — Other Ambulatory Visit: Payer: Self-pay | Admitting: *Deleted

## 2016-07-20 MED ORDER — ETONOGESTREL-ETHINYL ESTRADIOL 0.12-0.015 MG/24HR VA RING: 1.0000 | VAGINAL_RING | VAGINAL | 1 refills | Status: AC

## 2016-07-20 MED ORDER — ETONOGESTREL-ETHINYL ESTRADIOL 0.12-0.015 MG/24HR VA RING: 1.0000 | VAGINAL_RING | VAGINAL | 1 refills | Status: DC

## 2016-07-20 NOTE — Telephone Encounter (Signed)
I relayed the below to patient and she states FSH level was done else where and not in menopausal range, Rx for nuvaring sent.

## 2016-07-20 NOTE — Telephone Encounter (Signed)
Pt called and left message in traige voicemail regarding nuvaring denial. Per note on 12/16/15   "Patient will return back to the office on the week that she's off the NuvaRing [facility] may be [facility] at this point and may no longer need to be on contraception."  I called pt back to and received her voicemail, I asked pt to call to relay the above.

## 2016-09-11 ENCOUNTER — Other Ambulatory Visit: Payer: Self-pay | Admitting: Family Medicine

## 2016-09-11 ENCOUNTER — Other Ambulatory Visit: Payer: Self-pay | Admitting: Gynecology

## 2016-09-11 DIAGNOSIS — Z1231 Encounter for screening mammogram for malignant neoplasm of breast: Secondary | ICD-10-CM

## 2016-10-19 ENCOUNTER — Ambulatory Visit
Admission: RE | Admit: 2016-10-19 | Discharge: 2016-10-19 | Disposition: A | Discharge status: Home / Self Care | Payer: BLUE CROSS/BLUE SHIELD | Source: Ambulatory Visit | Attending: Family Medicine | Admitting: Family Medicine

## 2016-10-19 DIAGNOSIS — Z1231 Encounter for screening mammogram for malignant neoplasm of breast: Secondary | ICD-10-CM | POA: Insufficient documentation

## 2017-01-13 ENCOUNTER — Other Ambulatory Visit: Payer: Self-pay

## 2017-01-25 ENCOUNTER — Other Ambulatory Visit: Payer: Self-pay

## 2017-03-19 IMAGING — MG DIGITAL SCREENING BILATERAL MAMMOGRAM WITH CAD
4 series · 4 of 4 positions shown · non-contrast
Comparison: Previous exam(s).

CLINICAL DATA: Screening.

EXAM:
DIGITAL SCREENING BILATERAL MAMMOGRAM WITH CAD

[R MLO]
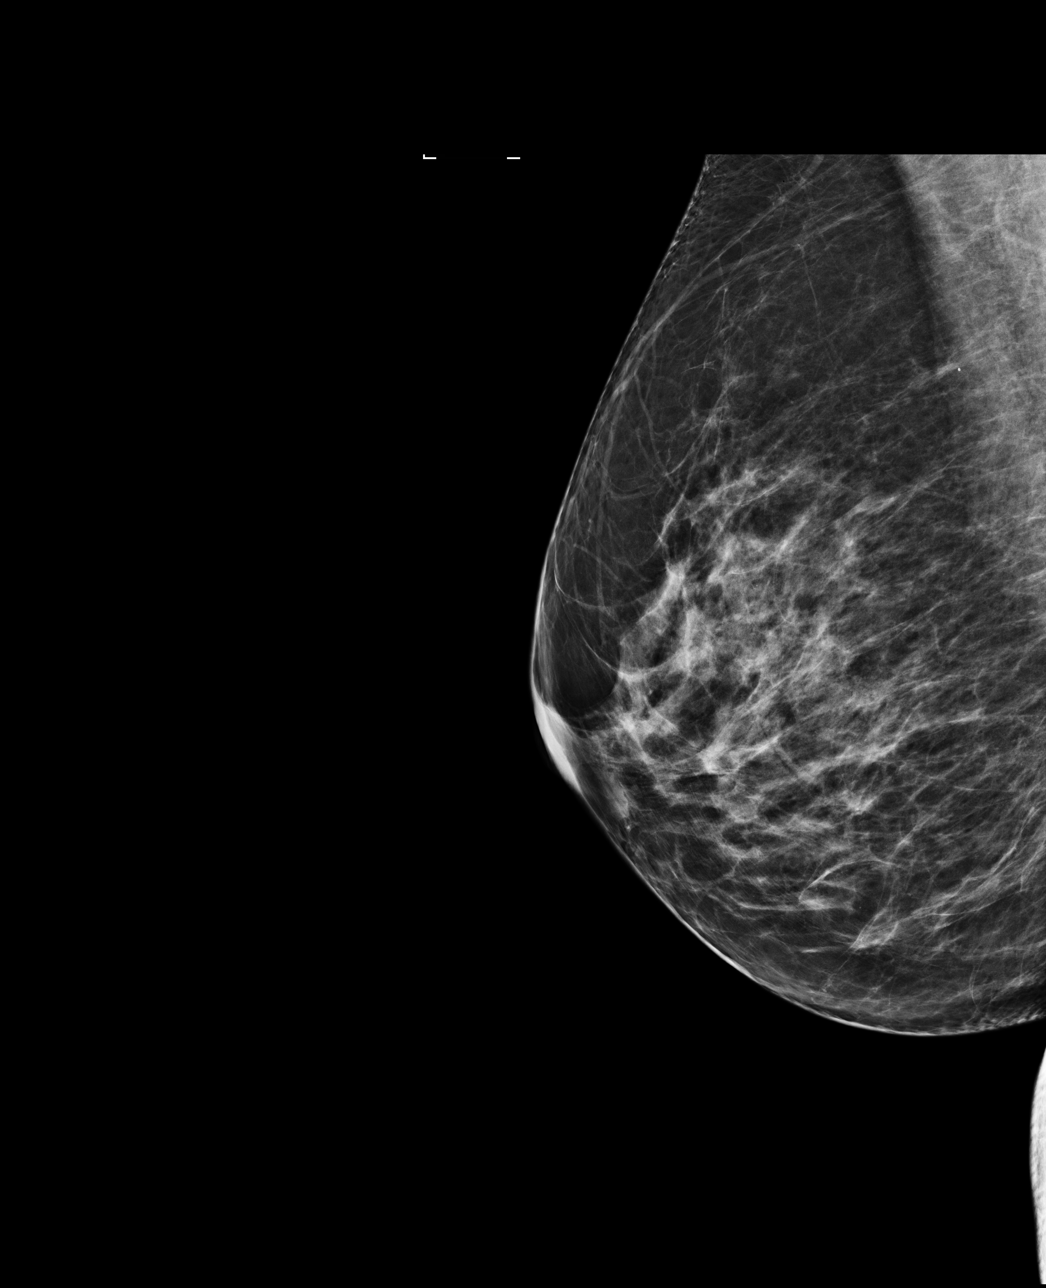

[L CC]
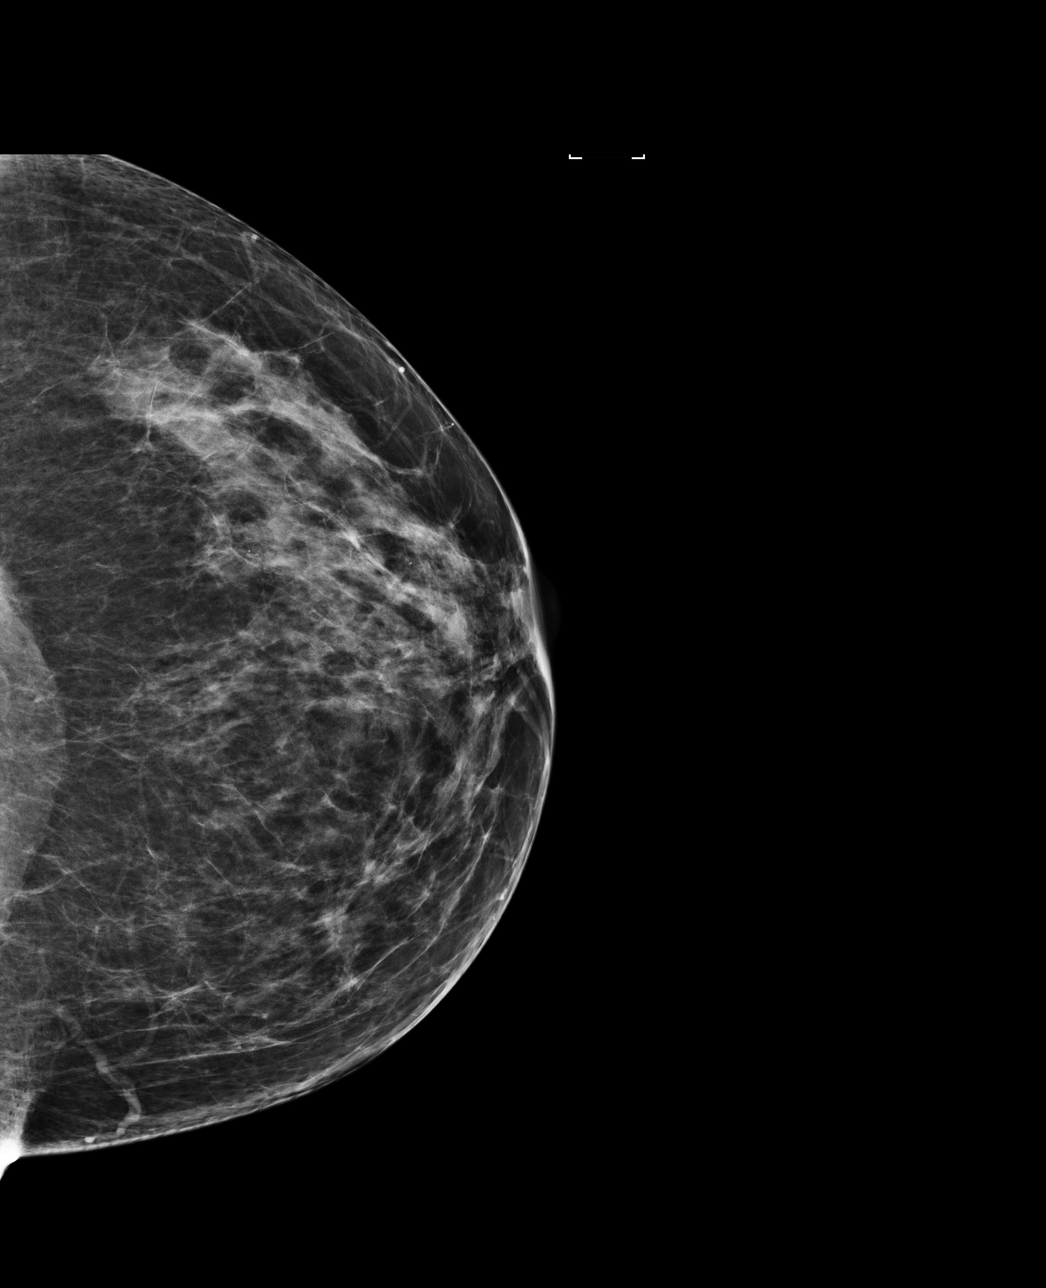

[R CC]
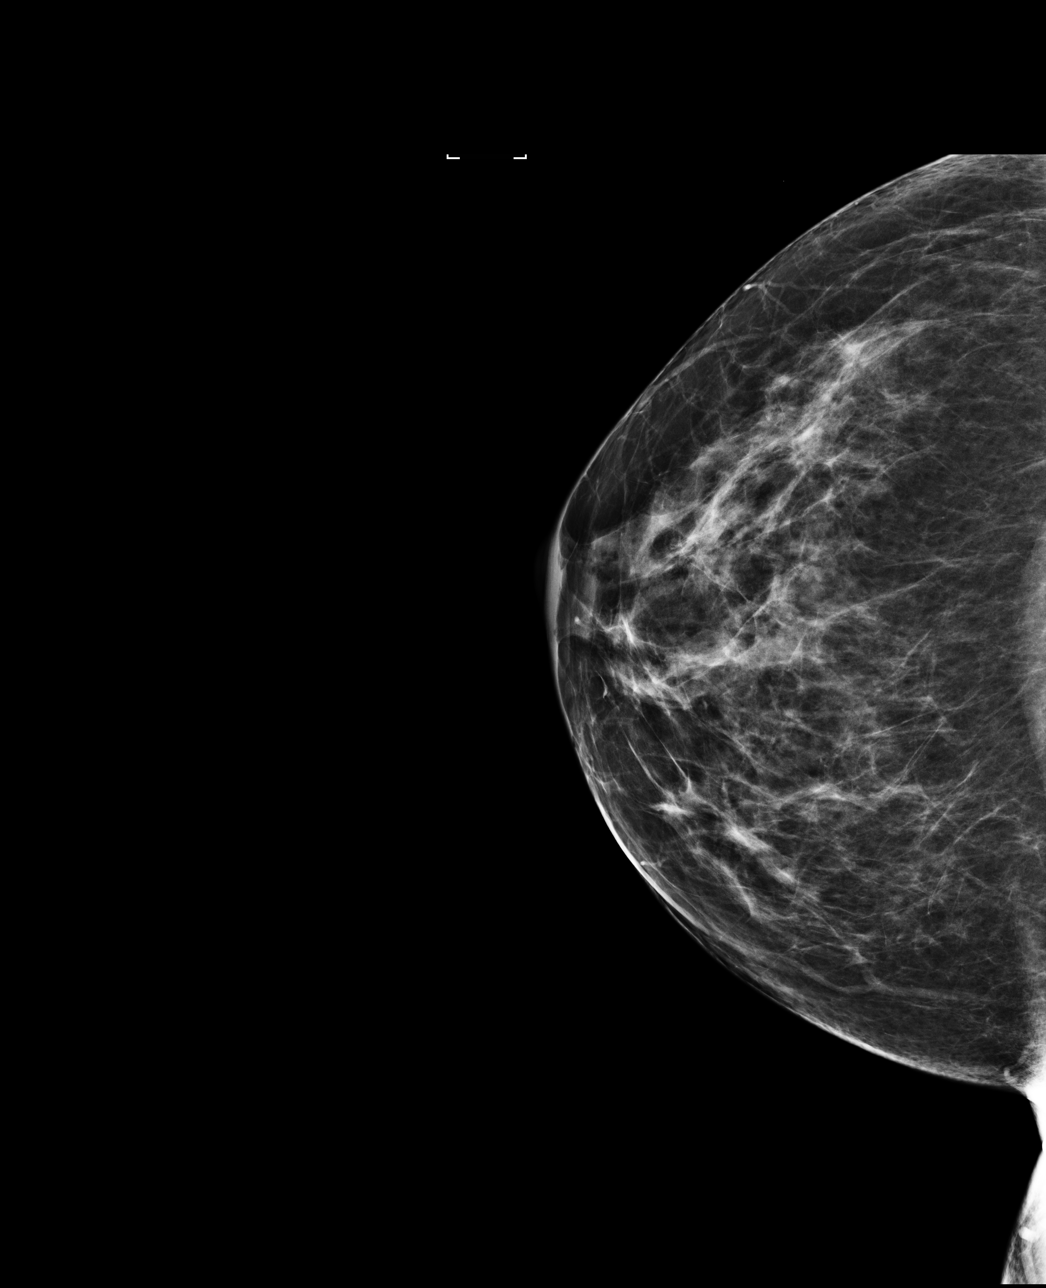

[L MLO]
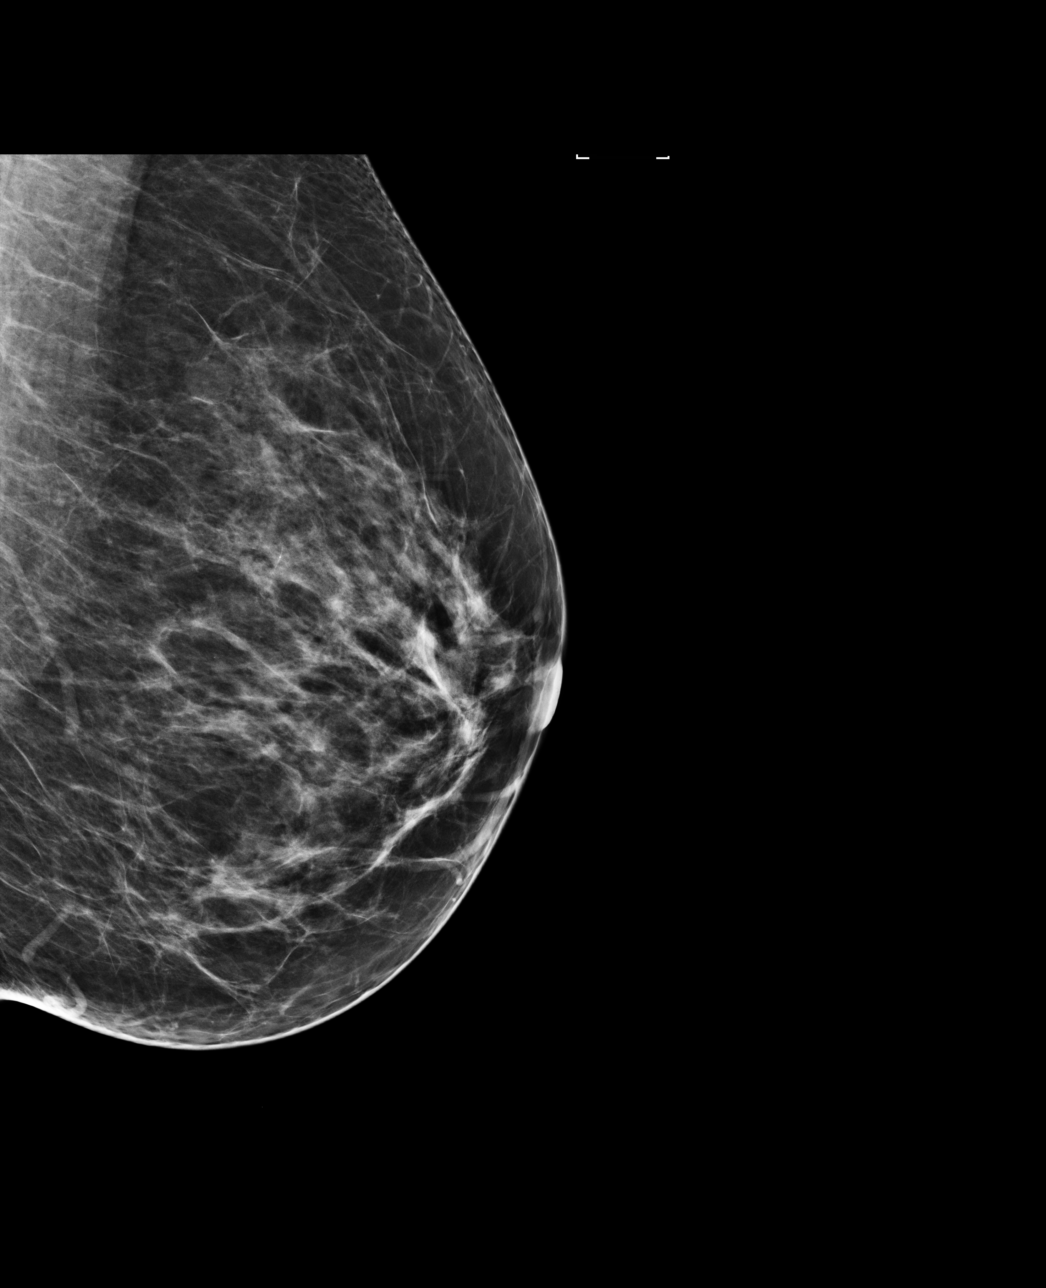

[4 of 4 positions shown; findings below may reference images not displayed]

ACR Breast Density Category c: The breast tissue is heterogeneously
dense, which may obscure small masses.
FINDINGS: There are no findings suspicious for malignancy. Images were
processed with CAD.
IMPRESSION: No mammographic evidence of malignancy. A result letter of this
screening mammogram will be mailed directly to the patient.

RECOMMENDATION:
Screening mammogram in one year. (Code:[id number])

BI-RADS CATEGORY  1: Negative.

## 2017-09-13 ENCOUNTER — Other Ambulatory Visit: Payer: Self-pay | Admitting: Family Medicine

## 2017-09-13 DIAGNOSIS — Z1231 Encounter for screening mammogram for malignant neoplasm of breast: Secondary | ICD-10-CM

## 2017-10-28 ENCOUNTER — Ambulatory Visit
Admission: RE | Admit: 2017-10-28 | Discharge: 2017-10-28 | Disposition: A | Discharge status: Home / Self Care | Payer: BLUE CROSS/BLUE SHIELD | Source: Ambulatory Visit | Attending: Family Medicine | Admitting: Family Medicine

## 2017-10-28 DIAGNOSIS — Z1231 Encounter for screening mammogram for malignant neoplasm of breast: Secondary | ICD-10-CM | POA: Diagnosis not present

## 2018-09-20 ENCOUNTER — Other Ambulatory Visit: Payer: Self-pay | Admitting: Family Medicine

## 2018-09-20 DIAGNOSIS — Z1231 Encounter for screening mammogram for malignant neoplasm of breast: Secondary | ICD-10-CM

## 2018-10-31 ENCOUNTER — Ambulatory Visit
Admission: RE | Admit: 2018-10-31 | Discharge: 2018-10-31 | Disposition: A | Discharge status: Home / Self Care | Payer: BLUE CROSS/BLUE SHIELD | Source: Ambulatory Visit | Attending: Family Medicine | Admitting: Family Medicine

## 2018-10-31 DIAGNOSIS — Z1231 Encounter for screening mammogram for malignant neoplasm of breast: Secondary | ICD-10-CM | POA: Insufficient documentation

## 2019-09-25 ENCOUNTER — Other Ambulatory Visit: Payer: Self-pay | Admitting: Family Medicine

## 2019-09-25 DIAGNOSIS — Z1231 Encounter for screening mammogram for malignant neoplasm of breast: Secondary | ICD-10-CM

## 2019-11-01 ENCOUNTER — Other Ambulatory Visit: Payer: Self-pay

## 2019-11-01 ENCOUNTER — Ambulatory Visit
Admission: RE | Admit: 2019-11-01 | Discharge: 2019-11-01 | Disposition: A | Discharge status: Home / Self Care | Payer: BC Managed Care – PPO | Source: Ambulatory Visit | Attending: Family Medicine | Admitting: Family Medicine

## 2019-11-01 DIAGNOSIS — Z1231 Encounter for screening mammogram for malignant neoplasm of breast: Secondary | ICD-10-CM | POA: Diagnosis present

## 2020-09-23 ENCOUNTER — Other Ambulatory Visit: Payer: Self-pay | Admitting: Family Medicine

## 2020-09-23 DIAGNOSIS — Z1231 Encounter for screening mammogram for malignant neoplasm of breast: Secondary | ICD-10-CM

## 2020-11-19 ENCOUNTER — Other Ambulatory Visit: Payer: Self-pay

## 2020-11-19 ENCOUNTER — Ambulatory Visit
Admission: RE | Admit: 2020-11-19 | Discharge: 2020-11-19 | Disposition: A | Discharge status: Home / Self Care | Payer: BC Managed Care – PPO | Source: Ambulatory Visit | Attending: Family Medicine | Admitting: Family Medicine

## 2020-11-19 DIAGNOSIS — Z1231 Encounter for screening mammogram for malignant neoplasm of breast: Secondary | ICD-10-CM | POA: Diagnosis not present

## 2021-03-23 ENCOUNTER — Emergency Department
Admission: EM | Admit: 2021-03-23 | Discharge: 2021-03-23 | Disposition: A | Discharge status: Home / Self Care | Payer: BC Managed Care – PPO | Attending: Emergency Medicine | Admitting: Emergency Medicine

## 2021-03-23 ENCOUNTER — Encounter: Payer: Self-pay | Admitting: Emergency Medicine

## 2021-03-23 ENCOUNTER — Other Ambulatory Visit: Payer: Self-pay

## 2021-03-23 DIAGNOSIS — X509XXA Other and unspecified overexertion or strenuous movements or postures, initial encounter: Secondary | ICD-10-CM | POA: Insufficient documentation

## 2021-03-23 DIAGNOSIS — S39012A Strain of muscle, fascia and tendon of lower back, initial encounter: Secondary | ICD-10-CM | POA: Diagnosis not present

## 2021-03-23 DIAGNOSIS — S3992XA Unspecified injury of lower back, initial encounter: Secondary | ICD-10-CM | POA: Diagnosis present

## 2021-03-23 MED ORDER — LIDOCAINE 5 % EX PTCH
1.0000 | MEDICATED_PATCH | Freq: Once | CUTANEOUS | Status: DC
  Administered 2021-03-23: 1 via TRANSDERMAL
  Filled 2021-03-23: qty 1

## 2021-03-23 MED ORDER — KETOROLAC TROMETHAMINE 30 MG/ML IJ SOLN
30.0000 mg | Freq: Once | INTRAMUSCULAR | Status: AC
  Administered 2021-03-23: 30 mg via INTRAMUSCULAR
  Filled 2021-03-23: qty 1

## 2021-03-23 NOTE — ED Provider Notes (Signed)
[facility] Provider Note    Event Date/Time   First MD Initiated Contact with Patient 03/23/21 1132     (approximate)   History   Chief Complaint Back Pain   HPI  Amy Jacobs is a 56 y.o. female with no significant past medical history presents to the ED complaining of back pain.  Patient ports that approximately 2 weeks ago she was initially playing with a dog when she felt a pulling and sudden onset of pain in the middle of her lower back.  She saw her PCP for this problem and was prescribed methocarbamol as well as tizanidine.  She states the symptoms were improving up until last night, when she sneezed and had an acute worsening of the pain in her lower back.  She describes the pain as dull and throbbing, worse with certain positions and movements.  It primarily affects the right side of her lower back but she denies any radiation of pain.  She has not had any numbness or weakness in her legs, denies saddle anesthesia.  She has not had any bowel or bladder incontinence, denies fevers.  She denies any specific trauma to her back.     Physical Exam   Triage Vital Signs: ED Triage Vitals [03/23/21 1128]  Enc Vitals Group     BP      Pulse      Resp      Temp      Temp src      SpO2      Weight 180 lb (81.6 kg)     Height 5\' 5"  (1.651 m)     Head Circumference      Peak Flow      Pain Score 8     Pain Loc      Pain Edu?      Excl. in [place]?     Most recent vital signs: Vitals:   03/23/21 1133  BP: (!) 158/78  Pulse: 81  Resp: 19  Temp: 98 F (36.7 C)  SpO2: 100%    Constitutional: Alert and oriented. Eyes: Conjunctivae are normal. Head: Atraumatic. Nose: No congestion/rhinnorhea. Mouth/Throat: Mucous membranes are moist.  Cardiovascular: Normal rate, regular rhythm. Grossly normal heart sounds.  2+ DP pulses bilaterally. Respiratory: Normal respiratory effort.  No retractions. Lungs CTAB. Gastrointestinal: Soft and nontender. No  distention. Musculoskeletal: No lower extremity tenderness nor edema.  No midline thoracic or lumbar spinal tenderness to palpation. Neurologic:  Normal speech and language. No gross focal neurologic deficits are appreciated.    ED Results / Procedures / Treatments   Labs (all labs ordered are listed, but only abnormal results are displayed) Labs Reviewed - No data to display    PROCEDURES:  Critical Care performed: No  Procedures   MEDICATIONS ORDERED IN ED: Medications  lidocaine (LIDODERM) 5 % 1 patch (1 patch Transdermal Patch Applied 03/23/21 1225)  ketorolac (TORADOL) 30 MG/ML injection 30 mg (30 mg Intramuscular Given 03/23/21 1228)     IMPRESSION / MDM / [place] / ED COURSE  I reviewed the triage vital signs and the nursing notes.                              56 y.o. female 56 year old female with no significant past medical history presents to the ED with increasing pain to the right lower portion of her back after sneezing last night.  Differential diagnosis includes,  but is not limited to, lumbar fracture, lumbar radiculopathy, cauda equina, lumbar strain.  Patient is well-appearing and in no acute distress, neurovascular intact to her bilateral lower extremities.  Given no traumatic mechanism and no signs or symptoms to suggest cauda equina, I do not feel x-ray or MRI imaging is indicated at this time.  Symptoms are consistent with a lumbar strain and we will treat symptomatically with IM Toradol and Lidoderm patch.  Patient reports that she has muscle relaxant and methocarbamol available at home, was counseled to continue these and follow-up with her PCP for potential physical therapy.  She was counseled to return to the ED for new worsening symptoms, patient agrees with plan.       FINAL CLINICAL IMPRESSION(S) / ED DIAGNOSES   Final diagnoses:  Strain of lumbar region, initial encounter     Rx / DC Orders   ED Discharge Orders     None         Note:  This document was prepared using Dragon voice recognition software and may include unintentional dictation errors.   Blake Divine, MD 03/23/21 1229

## 2021-03-23 NOTE — ED Notes (Signed)
See triage note  presents with lower back pain  states she developed pain couple of weeks ago  states she was feeling better until she sneezed yesterday  states pain is more severe but non radiating

## 2021-03-23 NOTE — ED Triage Notes (Signed)
Pt reports strained her lower back several weeks ago playing fetch with her dog. Pt reports saw her MD and it was getting better but then last night she sneezed and strained it again. Denies loss of bladder and bowel

## 2021-10-13 ENCOUNTER — Other Ambulatory Visit: Payer: Self-pay | Admitting: Family Medicine

## 2021-10-13 DIAGNOSIS — Z1231 Encounter for screening mammogram for malignant neoplasm of breast: Secondary | ICD-10-CM

## 2021-11-21 ENCOUNTER — Ambulatory Visit
Admission: RE | Admit: 2021-11-21 | Discharge: 2021-11-21 | Disposition: A | Discharge status: Home / Self Care | Payer: BC Managed Care – PPO | Source: Ambulatory Visit | Attending: Family Medicine | Admitting: Family Medicine

## 2021-11-21 DIAGNOSIS — Z1231 Encounter for screening mammogram for malignant neoplasm of breast: Secondary | ICD-10-CM | POA: Diagnosis present

## 2022-10-20 ENCOUNTER — Other Ambulatory Visit: Payer: Self-pay | Admitting: Family Medicine

## 2022-10-20 DIAGNOSIS — Z1231 Encounter for screening mammogram for malignant neoplasm of breast: Secondary | ICD-10-CM

## 2022-11-23 ENCOUNTER — Ambulatory Visit
Admission: RE | Admit: 2022-11-23 | Discharge: 2022-11-23 | Disposition: A | Discharge status: Home / Self Care | Payer: BC Managed Care – PPO | Source: Ambulatory Visit | Attending: Family Medicine | Admitting: Family Medicine

## 2022-11-23 DIAGNOSIS — Z1231 Encounter for screening mammogram for malignant neoplasm of breast: Secondary | ICD-10-CM | POA: Insufficient documentation

## 2023-06-23 ENCOUNTER — Emergency Department
Admission: EM | Admit: 2023-06-23 | Discharge: 2023-06-23 | Disposition: A | Discharge status: Home / Self Care | Attending: Emergency Medicine | Admitting: Emergency Medicine

## 2023-06-23 ENCOUNTER — Other Ambulatory Visit: Payer: Self-pay

## 2023-06-23 DIAGNOSIS — R42 Dizziness and giddiness: Secondary | ICD-10-CM | POA: Diagnosis present

## 2023-06-23 DIAGNOSIS — R110 Nausea: Secondary | ICD-10-CM | POA: Insufficient documentation

## 2023-06-23 DIAGNOSIS — Z8616 Personal history of COVID-19: Secondary | ICD-10-CM | POA: Insufficient documentation

## 2023-06-23 LAB — CBC
HCT: 44.7 % (ref 36.0–46.0)
Hemoglobin: 15.5 g/dL — ABNORMAL HIGH (ref 12.0–15.0)
MCH: 30.9 pg (ref 26.0–34.0)
MCHC: 34.7 g/dL (ref 30.0–36.0)
MCV: 89 fL (ref 80.0–100.0)
Platelets: 178 10*3/uL (ref 150–400)
RBC: 5.02 MIL/uL (ref 3.87–5.11)
RDW: 11.8 % (ref 11.5–15.5)
WBC: 6.4 10*3/uL (ref 4.0–10.5)
nRBC: 0 % (ref 0.0–0.2)

## 2023-06-23 LAB — BASIC METABOLIC PANEL WITH GFR
Anion gap: 9 (ref 5–15)
BUN: 11 mg/dL (ref 6–20)
CO2: 27 mmol/L (ref 22–32)
Calcium: 9.4 mg/dL (ref 8.9–10.3)
Chloride: 106 mmol/L (ref 98–111)
Creatinine, Ser: 0.78 mg/dL (ref 0.44–1.00)
GFR, Estimated: >60 mL/min (ref >60)
Glucose, Bld: 114 mg/dL — ABNORMAL HIGH (ref 70–99)
Potassium: 4.4 mmol/L (ref 3.5–5.1)
Sodium: 142 mmol/L (ref 135–145)

## 2023-06-23 MED ORDER — MECLIZINE HCL 25 MG PO TABS: 25.0000 mg | ORAL_TABLET | Freq: Three times a day (TID) | ORAL | 0 refills | Status: AC | PRN

## 2023-06-23 MED ORDER — MECLIZINE HCL 25 MG PO TABS: 25.0000 mg | ORAL_TABLET | Freq: Three times a day (TID) | ORAL | 0 refills | Status: DC | PRN

## 2023-06-23 MED ORDER — MECLIZINE HCL 25 MG PO TABS
25.0000 mg | ORAL_TABLET | Freq: Once | ORAL | Status: AC
  Administered 2023-06-23: 25 mg via ORAL
  Filled 2023-06-23: qty 1

## 2023-06-23 NOTE — ED Provider Notes (Signed)
 [facility] Provider Note    Event Date/Time   First MD Initiated Contact with Patient 06/23/23 1403     (approximate)   History   Dizziness   HPI  Amy Jacobs is a 58 y.o. female presents to the emergency department today because of concerns for dizziness.  She was sitting down when it occurred.  She felt very dizzy all of a sudden.  She was nauseous.  She did go down to the ground.  At the time my exam she is feeling somewhat better.  She was recently diagnosed with COVID.  Earlier today felt like her right ear was stuffy.  Patient denies any slurred speech or change in vision.  No weakness in her extremities.  Denies similar symptoms in the past.     Physical Exam   Triage Vital Signs: ED Triage Vitals  Encounter Vitals Group     BP 06/23/23 1246 (!) 179/98     Systolic BP Percentile --      Diastolic BP Percentile --      Pulse Rate 06/23/23 1246 74     Resp 06/23/23 1246 18     Temp 06/23/23 1246 (!) 97.5 F (36.4 C)     Temp Source 06/23/23 1246 Oral     SpO2 06/23/23 1246 99 %     Weight 06/23/23 1251 180 lb (81.6 kg)     Height 06/23/23 1251 5\' 6"  (1.676 m)     Head Circumference --      Peak Flow --      Pain Score 06/23/23 1251 0     Pain Loc --      Pain Education --      Exclude from Growth Chart --     Most recent vital signs: Vitals:   06/23/23 1246  BP: (!) 179/98  Pulse: 74  Resp: 18  Temp: (!) 97.5 F (36.4 C)  SpO2: 99%   General: Awake, alert, oriented. CV:  Good peripheral perfusion. Regular rate and rhythm. Resp:  Normal effort. Lungs clear. Abd:  No distention.  Other:  PERRL. EOMI. No upper extremity pronator drift, grip strength 5/5. Lower extremity leg raise 5/5 bilaterally. Sensation grossly intact. Bilateral TMs without erythema or bulging.    ED Results / Procedures / Treatments   Labs (all labs ordered are listed, but only abnormal results are displayed) Labs Reviewed  BASIC METABOLIC PANEL  WITH GFR - Abnormal; Notable for the following components:      Result Value   Glucose, Bld 114 (*)    All other components within normal limits  CBC - Abnormal; Notable for the following components:   Hemoglobin 15.5 (*)    All other components within normal limits  URINALYSIS, ROUTINE W REFLEX MICROSCOPIC  CBG MONITORING, ED     EKG  I, Marylynn Soho, attending physician, personally viewed and interpreted this EKG  EKG Time: 1253 Rate: 79 Rhythm: normal sinus rhythm Axis: normal Intervals: qtc 463 QRS: narrow, q waves v1, v2 ST changes: no st elevation Impression: abnormal ekg   RADIOLOGY None   PROCEDURES:  Critical Care performed: No  MEDICATIONS ORDERED IN ED: Medications - No data to display   IMPRESSION / MDM / ASSESSMENT AND PLAN / ED COURSE  I reviewed the triage vital signs and the nursing notes.  Differential diagnosis includes, but is not limited to, CVA, vertigo, anemia, electrolyte abnormality  Patient's presentation is most consistent with acute presentation with potential threat to life or bodily function.   Patient presented to the emergency department today because of concerns for dizziness.  No focal neurodeficits identified on exam.  Given recent history of viral illness and feeling of her ear being full earlier today I do think peripheral vertigo likely.  I did discuss this with the patient.  Blood work was checked without any concerning abnormalities.  Did discuss possibility of CVA and obtaining MRI however at this time patient felt comfortable deferring.  I think this is completely reasonable.  Will start patient on meclizine .  Will give patient ENT follow-up information.     FINAL CLINICAL IMPRESSION(S) / ED DIAGNOSES   Final diagnoses:  Dizziness  Vertigo      Note:  This document was prepared using Dragon voice recognition software and may include unintentional dictation errors.    Marylynn Soho, MD 06/23/23 [phone]

## 2023-06-23 NOTE — ED Triage Notes (Addendum)
 Patient comes in from home via pov with complaints of dizziness and nausea for the past hour or so. Pt states that the dizziness came on really suddenly while she was sitting down, pt thought that's she was about to pass out.  Pt has never felt this sensation before. Pt states that she recently tested positive for covid on Saturday night. Pt is alert and oriented x4, with no other signs of acute distress at this time.

## 2024-01-03 ENCOUNTER — Other Ambulatory Visit: Payer: Self-pay | Admitting: Family Medicine

## 2024-01-03 DIAGNOSIS — Z1231 Encounter for screening mammogram for malignant neoplasm of breast: Secondary | ICD-10-CM

## 2024-02-11 ENCOUNTER — Ambulatory Visit
Admission: RE | Admit: 2024-02-11 | Discharge: 2024-02-11 | Disposition: A | Source: Ambulatory Visit | Attending: Family Medicine | Admitting: Family Medicine

## 2024-02-11 DIAGNOSIS — Z1231 Encounter for screening mammogram for malignant neoplasm of breast: Secondary | ICD-10-CM
